# Patient Record
Sex: Female | Born: 1945 | Race: Black or African American | Hispanic: No | Smoking: Current some day smoker
Health system: Southern US, Community
[De-identification: ages and names within clinical notes are randomized; demographics above are authoritative.]

## PROBLEM LIST (undated history)

## (undated) DIAGNOSIS — M549 Dorsalgia, unspecified: Secondary | ICD-10-CM

## (undated) DIAGNOSIS — G459 Transient cerebral ischemic attack, unspecified: Secondary | ICD-10-CM

## (undated) DIAGNOSIS — T7840XA Allergy, unspecified, initial encounter: Secondary | ICD-10-CM

## (undated) DIAGNOSIS — I10 Essential (primary) hypertension: Secondary | ICD-10-CM

## (undated) DIAGNOSIS — I2510 Atherosclerotic heart disease of native coronary artery without angina pectoris: Secondary | ICD-10-CM

## (undated) DIAGNOSIS — M1990 Unspecified osteoarthritis, unspecified site: Secondary | ICD-10-CM

## (undated) DIAGNOSIS — E785 Hyperlipidemia, unspecified: Secondary | ICD-10-CM

## (undated) DIAGNOSIS — I639 Cerebral infarction, unspecified: Secondary | ICD-10-CM

## (undated) DIAGNOSIS — K219 Gastro-esophageal reflux disease without esophagitis: Secondary | ICD-10-CM

## (undated) DIAGNOSIS — F419 Anxiety disorder, unspecified: Secondary | ICD-10-CM

## (undated) DIAGNOSIS — R55 Syncope and collapse: Secondary | ICD-10-CM

## (undated) DIAGNOSIS — J449 Chronic obstructive pulmonary disease, unspecified: Secondary | ICD-10-CM

## (undated) DIAGNOSIS — I219 Acute myocardial infarction, unspecified: Secondary | ICD-10-CM

## (undated) DIAGNOSIS — G5600 Carpal tunnel syndrome, unspecified upper limb: Secondary | ICD-10-CM

## (undated) HISTORY — DX: Atherosclerotic heart disease of native coronary artery without angina pectoris: I25.10

## (undated) HISTORY — PX: TUBAL LIGATION: SHX77

## (undated) HISTORY — DX: Acute myocardial infarction, unspecified: I21.9

## (undated) HISTORY — DX: Dorsalgia, unspecified: M54.9

## (undated) HISTORY — DX: Transient cerebral ischemic attack, unspecified: G45.9

## (undated) HISTORY — DX: Gastro-esophageal reflux disease without esophagitis: K21.9

## (undated) HISTORY — DX: Allergy, unspecified, initial encounter: T78.40XA

## (undated) HISTORY — DX: Anxiety disorder, unspecified: F41.9

## (undated) HISTORY — PX: PAROTIDECTOMY: SUR1003

## (undated) HISTORY — DX: Syncope and collapse: R55

## (undated) HISTORY — DX: Cerebral infarction, unspecified: I63.9

## (undated) HISTORY — DX: Chronic obstructive pulmonary disease, unspecified: J44.9

## (undated) HISTORY — DX: Hyperlipidemia, unspecified: E78.5

## (undated) HISTORY — PX: CARDIAC CATHETERIZATION: SHX172

## (undated) HISTORY — DX: Carpal tunnel syndrome, unspecified upper limb: G56.00

## (undated) HISTORY — DX: Unspecified osteoarthritis, unspecified site: M19.90

## (undated) HISTORY — DX: Essential (primary) hypertension: I10

---

## 2005-03-27 ENCOUNTER — Emergency Department (HOSPITAL_COMMUNITY): Admission: EM | Admit: 2005-03-27 | Discharge: 2005-03-27 | Payer: Self-pay | Admitting: Emergency Medicine

## 2007-10-20 ENCOUNTER — Ambulatory Visit: Payer: Self-pay | Admitting: Cardiology

## 2008-01-28 ENCOUNTER — Ambulatory Visit: Payer: Self-pay | Admitting: Cardiology

## 2008-01-29 ENCOUNTER — Encounter: Payer: Self-pay | Admitting: Cardiology

## 2008-02-18 ENCOUNTER — Encounter: Payer: Self-pay | Admitting: Cardiology

## 2008-04-09 ENCOUNTER — Encounter: Payer: Self-pay | Admitting: Cardiology

## 2008-08-08 ENCOUNTER — Ambulatory Visit: Payer: Self-pay | Admitting: Cardiology

## 2008-08-29 ENCOUNTER — Ambulatory Visit: Payer: Self-pay | Admitting: Cardiology

## 2008-10-06 ENCOUNTER — Ambulatory Visit: Payer: Self-pay | Admitting: Cardiology

## 2009-04-13 ENCOUNTER — Ambulatory Visit: Payer: Self-pay | Admitting: Cardiology

## 2009-04-13 DIAGNOSIS — E785 Hyperlipidemia, unspecified: Secondary | ICD-10-CM | POA: Insufficient documentation

## 2009-04-13 DIAGNOSIS — R040 Epistaxis: Secondary | ICD-10-CM | POA: Insufficient documentation

## 2009-04-13 DIAGNOSIS — D696 Thrombocytopenia, unspecified: Secondary | ICD-10-CM | POA: Insufficient documentation

## 2009-04-13 DIAGNOSIS — G459 Transient cerebral ischemic attack, unspecified: Secondary | ICD-10-CM | POA: Insufficient documentation

## 2011-02-15 NOTE — Assessment & Plan Note (Signed)
[facility]                          [facility]   Sophia Briggs, Sophia Briggs              MRN:          [id number]  DATE:04/13/2009                            DOB:          [birth year]    REFERRING PHYSICIAN:  Dr. Ernestine Conrad   HISTORY OF PRESENT ILLNESS:  The patient is a very pleasant 65 year old  female with a history of coronary artery disease.  The patient has no  recurrent substernal chest pain.  She complains of significant weight  gain of almost 20 pounds.  She did receive steroid injections up until a  few months ago for back problems.  She was wondering also whether she  had increased fluid gain and she was given a diuretic by Dr. Loney Hering.  However, on clinical exam, the patient has no peripheral edema.  She  also states that she is not particularly increased her caloric intake on  her diet, although she does like mac and cheese.   MEDICATIONS:  1. Imdur 30 mg p.o. daily.  2. Vitamin D.  3. Amlodipine 5 mg p.o. daily.  4. Protonix 40 mg p.o. daily.  5. Coreg 25 mg p.o. q.12 h.  6. Crestor 10 mg one-half tablet p.o. daily.  7. Aspirin 81 mg a day.  8. Lisinopril hydrochlorothiazide 10/12.5 mg p.o. daily.  9. Nitroglycerine p.r.n.  10.Vicodin p.r.n.   PHYSICAL EXAMINATION:  VITAL SIGNS:  Blood pressure is 138/87, heart  rate is 65, and weight is 201.  HEENT:  Pupils, eyes are equal.  Conjunctivae clear.  NECK:  Supple.  Normal carotid upstroke.  No carotid bruits.  LUNGS:  Clear breath sounds bilaterally.  HEART:  Regular rate and rhythm.  Normal S1 and S2.  No murmur, rubs, or  gallops.  ABDOMEN:  Soft and nontender.  No rebound or guarding.  Good bowel  sounds.  EXTREMITIES:  No cyanosis, clubbing, or edema.  NEUROLOGIC:  The patient is alert and oriented.  Grossly nonfocal.   PROBLEM LIST:  1. Coronary artery disease.      a.     Status post myocardial infarction, treated medically with       patent stent  site.      b.     Status post myocardial infarction/Taxus stenting over the       left anterior descending artery in 2006 at [facility].      c.     Normal adenosine Cardiolite stress study with ejection       fraction of 68% in January 2009.  2. Lower back pain followed at [facility].  3. Hypertension.  4. Weight gain.  5. Dyslipidemia.  6. History of thrombocytopenic.  7. History of epistaxis.  8. History of transient ischemic attack.   PLAN:  1. The patient is stable.  I made no change in her medical regimen.  2. I educated the patient about exercise and weight control.  3. No evidence of definite volume overload and I did not change her      diuretic therapy.     Learta Codding, MD,FACC  Electronically Signed    GED/MedQ  DD: 04/13/2009  DT: 04/14/2009  Job #: [id number]   cc:   Dr. Ernestine Conrad

## 2011-02-15 NOTE — Assessment & Plan Note (Signed)
[facility]                          [facility]   Sophia Briggs, Sophia Briggs              MRN:          [id number]  DATE:08/08/2008                            DOB:          01-Jul-1946    CARDIOLOGIST:  Learta Codding, MD, [facility].   PRIMARY CARE PHYSICIAN:  Dr. Ernestine Briggs.   REASON FOR VISIT:  A 24-month followup.   HISTORY OF PRESENT ILLNESS:  Sophia Briggs is a 65 year old female patient  with a history of coronary artery disease status post prior myocardial  infarction in 2006 treated with a Taxus drug-eluting stent to the LAD at  [facility].  She had subsequent followup cardiac  catheterization in May 2007 that revealed nonobstructive coronary artery  disease.  She was seen by our service at [facility] in June 2009  and underwent a Cardiolite study that demonstrated normal LV function  with an EF of 68% and no ischemia.  She was last seen in the office in  April 2009.  She returns for followup today.  We referred her to  Neurology and she subsequently was referred to orthopedics secondary to  significant lower back problems as well as lower extremity weakness.  She is due to follow up with orthopedics in the next week.  She has been  having a significant amount of leg pain.  She also notes chest  discomfort.  She had an episode of chest discomfort this past weekend  that was somewhat severe and lasted almost all day.  It seems to be  related to meals.  She had a burning-type sensation.  She had no  radiation to her arm or jaw.  She did note some slight shortness of  breath.  She took Alka-Seltzer with minimal relief.  She denies any  syncope.  She has been working out at [employer] with [employer] 3  times a week.  She notes occasional exertional chest discomfort.  She  states that this is fairly new over the last 2-3 months.  She also notes  episodes of exertion without resultant chest discomfort.  She  does note  some symptoms of acid reflux in the past.  She was on Protonix  previously, but was subsequently taken off of this.   Of note, at her last visit, we arranged arterial Dopplers and ABIs.  Her  ABIs were normal bilaterally.   CURRENT MEDICATIONS:  1. Lisinopril 10 mg daily.  2. Crestor 5 mg daily.  3. Hydroxyzine 25 mg daily.  4. Toprol-XL 25 mg daily.  5. Imdur 30 mg daily.  6. Aspirin 325 mg daily.  7. Vitamin D 1 g 2 tablets b.i.d.   ALLERGIES:  No known drug allergies.   SOCIAL HISTORY:  The patient quit smoking about 3 months ago.   REVIEW OF SYSTEMS:  Please see HPI.  She denies any fever, chills,  cough, melena, hematochezia, hematuria, or dysuria.  The rest of the  review of systems are negative.   PHYSICAL EXAMINATION:  GENERAL:  She is a well-nourished, well-developed  female in acute distress.  VITAL SIGNS:  Blood pressure is 156/99, pulse 55,  and weight 191.8  pounds.  HEENT:  Normal.  NECK:  Without JVD.  LYMPH:  Without lymphadenopathy.  CARDIAC:  Normal S1 and S2.  Regular rate and rhythm.  LUNGS:  Clear to auscultation bilaterally.  ABDOMEN:  Soft and nontender.  EXTREMITIES:  Without edema.  NEUROLOGIC:  She is alert and oriented x3.  Cranial nerves II-XII are  grossly intact.   Electrocardiogram reveals sinus bradycardia with a heart rate of 56,  normal axis, and no acute changes.   ASSESSMENT AND PLAN:  1. Chest pain.  She has a history of coronary artery disease status      post Taxus drug-eluting placement to the left anterior descending      in 2006 at [facility] and nonobstructive disease      by cardiac catheterization in May 2007.  Specifically at that time,      she had a 50% ostial diagonal lesion and 30% proximal left anterior      descending lesion.  Followup Cardiolite testing in January 2009      demonstrated no ischemia.  Her ejection fraction is well preserved.      Her symptoms are atypical and sound more  consistent with acid      reflux disease.  However, she has noted a change in the pattern of      her chest symptoms.  According to the prior note, she has had a      history of chronic stable chest pain since her intervention in      2006.  She does note some symptoms with exertion.  She also notes      an absence of symptoms with exertion at times as well.  I have      recommended that we proceed with stress Cardiolite testing to rule      out the possibility of ischemia.  I have also recommended that we      will place her on proton pump inhibitor therapy to cover for      gastrointestinal etiology.  We will see her back in several weeks      in followup.  2. Hypertension.  This is uncontrolled.  I have elected to place her      on amlodipine 5 mg daily.  This would serve as an antianginal as      well.  3. Dyslipidemia.  She is having some leg pain.  She questions whether      or not this is related to her Crestor.  I have explained to her      that she can come off her Crestor for couple of weeks to see if      this alleviates any of her symptoms.  We will follow up with a CMET      and lipid panel to reassess her lipid management.  4. History of thrombocytopenia.  At the last check in July 2009, her      platelets were stable at 135.  We will check a repeat CBC to follow      up on her thrombocytopenia.  5. Chronic low back pain and leg pain.  She will continue to follow up      with the neurologist and orthopedist as directed.  6. Gastroesophageal reflux disease.  As outlined above, we will      reinitiate Protonix at 40 mg daily.   DISPOSITION:  The patient will be brought back in followup with Dr.  Andee Lineman in  the next 6 weeks or sooner p.r.n.       Tereso Newcomer, PA-C  Electronically Signed      Learta Codding, MD,FACC  Electronically Signed   SW/MedQ  DD: 08/08/2008  DT: 08/09/2008  Job #: [id number]   cc:   Sophia Briggs

## 2011-02-15 NOTE — Assessment & Plan Note (Signed)
[facility]                          [facility]   JISELL, MAJER              MRN:          [id number]  DATE:08/29/2008                            DOB:          Aug 26, 1946    PRIMARY CARDIOLOGIST:  Learta Codding, MD,FACC   REASON FOR VISIT:  Post-hospital followup.   Ms. Henneke reportedly was diagnosed with a heart attack earlier this  month while she was visiting in the [place] area recently.  She was  reportedly briefly hospitalized at [facility] (11/8 - 11, 2009), following a  complaint of chest pain.  She was reportedly hypertensive and underwent  subsequent cardiac catheterization, suggesting continued patency of her  previously placed stents.  There are currently no hospital records for  review.   Ms. Phoenix was placed on amlodipine, Coreg, and had uptitration of both  aspirin and Imdur.   The patient denies any recurrent angina pectoris.  She also reports much  better control of her blood pressure.   Ms. Weyandt is also currently undergoing evaluation for lower back pain  radiating down her left leg.  She apparently is being treated for a  probable herniated disk.  She does have scheduled followup at [facility]  regarding this.   EKG today indicates sinus bradycardia at 52 bpm with normal axis and  nonspecific ST abnormalities.   CURRENT MEDICATIONS:  1. Lisinopril 10 daily.  2. Imdur 30 daily.  3. Aspirin 325 daily.  4. Amlodipine 5 daily.  5. Protonix.  6. Coreg to 25 q. 12.   PHYSICAL EXAMINATION:  GENERAL:  Blood pressure 136/72, pulse 64,  regular, and respiration 16.  GENERAL:  A 65 year old female sitting upright in no distress.  HEENT:  Normocephalic, atraumatic.  NECK:  Palpable bilateral carotid pulses without bruits; no JVD.  LUNGS:  Clear to auscultation in all fields.  HEART:  Regular rate and rhythm.  No significant murmurs.  No rubs.  ABDOMEN:  Soft, nontender.  EXTREMITIES:  Stable right  groin with no hematoma, ecchymosis, bruit, or  auscultation.  Intact femoral and distal pulses.  No focal deficit.   IMPRESSION:  1. Coronary artery disease.      a.     Status post reported, recent myocardial infarction, treated       medically, with reportedly patent stent sites.      b.     Status post myocardial infarction/ Taxus stenting of LAD in       2006 [facility]).      c.     Normal adenosine stress Cardiolite; EF 68%, January 2009.  2. Lower back pain.      a.     Presumably secondary to herniated disks.      b.     Nonobstructive CAD by catheterization, May 2007.  3. Hypertension, much improved.  4. Dyslipidemia.  5. History of thrombocytopenia.  6. History of epistaxis, Plavix discontinued.  7. History of transient ischemic attacks.   PLAN:  1. Continue current medication regimen.  2. Request hospital records from [facility], with respect to her recent      hospitalization.  3. Resume Crestor  at previous dose.  This was recently stopped,      following complaint of persistent leg pain.  She indicates that      this did not resolve her symptoms and it is, therefore, unlikely      that these pains were related to Crestor.  We will resume the      Crestor and then reassess her lipid status in 3 months, following      her next office visit.      Gene Serpe, PA-C  Electronically Signed      Learta Codding, MD,FACC  Electronically Signed   GS/MedQ  DD: 08/29/2008  DT: 08/29/2008  Job #: [id number]   cc:   Darcus Pester, MD

## 2011-02-15 NOTE — Assessment & Plan Note (Signed)
[facility]                          [facility]   IEESHA, ABBASI              MRN:          [id number]  DATE:01/28/2008                            DOB:          Jun 10, 1946    CARDIOLOGIST:  She is new to Dr. Andee Lineman.   PRIMARY CARE PHYSICIAN:  She is not yet established, but is going to try  to establish with [facility].   REASON FOR VISIT:  Post hospitalization follow up.   HISTORY OF PRESENT ILLNESS:  Ms. Morning is a very pleasant 65 year old  female patient with a history of coronary artery disease previously  followed by Dr. Dortha Kern of [facility] in [place].  Her  previous cardiac history includes what sounds like non-ST-elevation  myocardial infarction in 2006.  She was initially seen at [facility] and discharged.  She presented back with chest pain and was  transferred to [facility] where she underwent a cardiac  catheterization that revealed a 90% proximal LAD lesion and a 90% ostial  diagonal lesion.  Her EF was 60-65%.  At that time, she was treated with  a Taxus drug-eluting stent, 3 mm x 8 mm.  She had a follow-up cardiac  catheterization in 2007 that revealed a 50% ostial diagonal lesion, 30%  proximal LAD lesion.  She had normal LV systolic function, and her  aortogram distal aortogram revealed mild, less than 30% right renal  artery stenosis.   She was seen by one of our cardiology fellows at [facility] on  October 20, 2007 secondary to elevated cardiac troponins.  The patient  had presented with symptoms of epistaxis in the setting of accelerated  hypertension with a blood pressure of 240/120.  She also exhibited some  left-sided paresthesias concerning for TIA symptoms in the setting of  accelerated hypertension.  Of note, her cardiac enzymes were abnormal.  Her troponins went from 0.02 to 0.01 to 0.18 to 0.18 to 0.16.  However,  her CK-MBs remained  completely negative throughout.   While hospitalized, the patient underwent an echocardiogram and  Cardiolite stress study.  Her adenosine Cardiolite revealed an EF of 68%  and normal LV perfusion.  Her echocardiogram revealed an EF of 55-60%,  mild LVH.   Given the patient's accelerated hypertension, she also underwent CT  angiogram to rule out renal artery stenosis.  She had a normal aorta and  mesenteric arteries.  She had mild calcified plaque in the bilateral  proximal renal arteries.  There was no significant renal artery stenosis  noted.  Also of note, she had an MRI of the brain that showed no acute  intracranial abnormality.  She did have mild chronic demyelination  changes in the right and left frontal lobe white matter.   The patient was initially supposed to be seen by [place] in follow-up in  March, but her appointment was changed due to an emergency in the  office.  She returns today for follow up.  She notes overall she is  doing well.  She does have some symptoms of leg pain.  She actually has  several symptoms.  One  includes discomfort in bilateral lower  extremities at night.  This is relieved with moving her extremities.  She also has pain from time to time in her legs with walking.  It that  seems to resolve with rest.  She also has weakness in her lower  extremities.  This had been ongoing for several months now.  She denies  any syncope, near syncope or dizziness or lightheadedness.  As she gets  up from a lying or sitting position, her legs become extremely weak and  she falls.  She has fallen several times and actually has to walk with a  cane now.   The patient notes chronic exertional chest pain since her cardiac  catheterization and stenting in 2006.  This is fairly stable without  change.  She denies any significant dyspnea on exertion.  She denies any  syncope or palpitations.   The patient does note to me today a symptom of chest discomfort with  eating.   She denies any true dysphagia or odynophagia.  She has been  diagnosed with acid reflux disease in the past.   CURRENT MEDICATIONS:  1. Lisinopril 10 mg daily.  2. Imdur 15 mg daily.  3. Crestor 5 mg daily.  4. Hydroxyzine 25 mg daily.  5. Aspirin 81 mg daily.  6. Toprol XL 25 mg daily.  7. Vistaril p.r.n.  8. Hydroxyzine p.r.n.  9. Advair p.r.n.   ALLERGIES:  NO KNOWN DRUG ALLERGIES.   PHYSICAL EXAMINATION:  GENERAL:  She is a well-nourished, well-developed  female in no distress.  VITAL SIGNS:  Blood pressure is 128/80, pulse 66, weight 192.6 pounds,  oxygen saturation 97% on room air.  HEENT:  Normal.  NECK:  Without JVD, without lymphadenopathy.  CARDIAC:  Normal S1 and S2.  Regular rate and rhythm.  LUNGS:  Clear to auscultation bilaterally.  ABDOMEN:  Soft, nontender.  EXTREMITIES:  Without edema.  Calves soft, nontender.  SKIN:  Warm and dry.  NEUROLOGIC:  She is alert and oriented x3.  Cranial nerves II-XII  grossly intact.  VASCULAR:  Dorsalis pedis pulse and posterior tibialis pulses 2+  bilaterally.  A cannot appreciate any femoral artery bruits bilaterally.  NEUROLOGIC:  Distal sensation is intact.  Patellar and Achilles deep  tendon reflexes are 2+ bilaterally.  MUSCULOSKELETAL:  She has 5/5 strength in bilateral lower extremities.   IMPRESSION:  1. Coronary artery disease.      a.     Status post Taxus drug-eluting stent placement to the LAD in       2006 at [facility].      b.     Nonobstructive coronary artery disease by catheterization in       May of 2007.      c.     Recent nonischemic adenosine Cardiolite in January of 2009.      d.     Chronic stable angina pectoris.  2. Good left ventricular function.  3. Hypertension.      a.     Recent admission with accelerated hypertension and transient       ischemic attack symptoms.  4. Treated dyslipidemia.  Lipid panel in January of 2009 with a total      cholesterol of 103, HDL 34, LDL  58 and triglycerides of 55.  5. Recent history of epistaxis and thrombocytopenia.      a.     Platelet count as low as 119,000 while hospitalized.  6. Leg pain.  a.     Question intermittent claudication.      b.     Question restless leg syndrome.  7. History of lower extremity weakness - rule out neurologic process.  8. Gastroesophageal reflux disease.   DISCUSSION:  Ms. Oshea returns to the office today for post  hospitalization follow up.  Overall, from a cardiac standpoint she seems  to be fairly stable.  She describes chest symptoms that seem to be  consistent with stable exertional angina pectoris.  There has been no  significant change since her stenting in 2006.  She recently was  admitted with epistaxis and thrombocytopenia.  Her thrombocytopenia was  fairly mild.  However, her Plavix was discontinued.  It had been 3 years  since her stenting.  She is continued on aspirin, albeit at 81 mg a day.  She did have a drug-eluting stent placed in 2006.  She also has other  symptoms.  She does describe some chest symptoms with eating that seem  to be consistent with gastroesophageal reflux disease.  She also  describes some lower extremity weakness from time to time.  The etiology  of this is unclear, and I think she needs further evaluation.  She also  describes some leg pain.  She is due to see [facility] soon.  I think further workup for restless leg syndrome should be pursued.  In  the interim, we will work her up for vascular disease in her lower  extremities.   PLAN:  1. Increase Imdur to 30 mg daily for anti-anginal effect.  2. Prescribe ranitidine 150 mg b.i.d. for treatment of acid reflux      disease.  3. I have asked her to increase her aspirin to 325 mg daily, as she      does have a history of Taxus drug-eluting stent in her LAD.  She      will continue off of Plavix.  We will check a CBC today and follow      up on a CBC in 2 weeks to ensure that  her platelet counts are      stable.  4. She will be set up for ABIs and arterial Dopplers to rule out      significant vascular disease contributing to some of her leg      symptoms.  I have asked her to follow up with her new primary care      physician for further evaluation of restless leg syndrome.  5. We will also refer her to neurology for her leg weakness.  Her      neurologic exam seems to be stable today without deficit.  Further      recommendations will be per neurology.  6. The patient will be brought back in follow up in the next 3 months.      In the interim, we will follow up on LFTs as she does continue on      Crestor therapy.      Tereso Newcomer, PA-C  Electronically Signed      Learta Codding, MD,FACC  Electronically Signed   SW/MedQ  DD: 01/28/2008  DT: 01/28/2008  Job #: [id number]

## 2011-02-15 NOTE — Assessment & Plan Note (Signed)
[facility]                          [facility]   Sophia Briggs, SCHNEEBERGER              MRN:          [id number]  DATE:10/06/2008                            DOB:          03-03-1946    REFERRING PHYSICIAN:  Ernestine Conrad, MD   HISTORY OF PRESENT ILLNESS:  The patient is a 65 year old female who was  recently hospitalized at [facility] with presumed  heart attack.  We still have not received the results, but report  related the patient with nonobstructive coronary artery disease and no  stent was placed.  She was already seen by PA in the meanwhile on  August 29, 2008.  From cardiac standpoint, she is doing well.  She has  no chest pain, shortness of breath, orthopnea, or PND.  She does have  left leg pain and has undergone steroid injection, and still scheduled  go back for TENS unit.   MEDICATIONS:  1. Imdur 30 mg p.o. daily.  2. Vitamin D.  3. Amlodipine 5 mg as directed.  4. Protonix 40 mg 1 tablet p.o. daily.  5. Coreg 25 mg p.o. q.12 h.  6. Crestor 10 mg p.o. daily.  7. Aspirin 81 mg p.o. daily.   PHYSICAL EXAMINATION:  VITAL SIGNS:  Blood pressure 133/82, heart rate  62, and weight 187 pounds.  NECK:  Normal carotid upstroke.  No carotid bruits.  LUNGS:  Clear breath sounds bilaterally.  HEART:  Regular rate and rhythm.  Normal S1 and S2.  No murmurs, rubs,  or gallops.  ABDOMEN:  Soft and nontender.  No rebound or guarding.  Good bowel  sounds.  EXTREMITIES:  No cyanosis, clubbing, or edema.  NEUROLOGIC:  The patient is alert, oriented, and grossly nonfocal.   PROBLEMS:  1. Coronary artery disease.      a.     Status post recent myocardial infarction, treated medically       with patent stent sites.      b.     Status post myocardial infarction/Taxus stenting of left       anterior descending in 2006 [facility]).      c.     Normal adenosine Cardiolite stress; ejection fraction  68%,       January 2009.  2. Lower back pain, followed at [facility].  3. Hypertension, controlled.  4. Dyslipidemia.  5. History of thrombocytopenia.  6. History of epistaxis.  7. History of transient ischemic attack.   PLAN:  1. The patient from cardiac standpoint is doing well.  I made no      change in medical regimen.  Her blood pressure is well controlled.  2. The patient needs to continue on risk factor modification.  3. The patient can follow up at [facility] for her leg pain.     Learta Codding, MD,FACC  Electronically Signed    GED/MedQ  DD: 10/06/2008  DT: 10/07/2008  Job #: [id number]   cc:   Ernestine Conrad, MD

## 2011-02-18 NOTE — Letter (Signed)
Feb 18, 2008    [employer]  RE: [name]  [address]  [place], [place] [address]   RE:  [name], [name]  MRN:  [id number]  /  DOB:  02/02/1946   To Whom It May Concern:   Sophia Briggs is a 65 year old female patient followed in our practice  with a history of coronary artery disease.  She has a prior history of  myocardial infarction treated with stenting to her coronary artery.  She  had a recent admission to [facility] in January of 2009 with  accelerated hypertension and transient ischemic attack symptoms.  She  was last seen in follow-up in our office in April of 2009.  Unfortunately, she continues to experience chest discomfort.  The  etiology of this is not entirely clear.  She also had a recent complaint  of bilateral lower extremity pain and weakness.  The workup of this is  ongoing.  Given Sophia Briggs's multiple medical problems and symptoms,  she does not feel she can participate in jury duty.  In light of the  aforementioned problems, I would agree with that.  If at all possible,  please excuse her from her upcoming jury duty.  If you have any  questions, please feel free to contact our office in [place] at [phone].    Sincerely,       Tereso Newcomer, PA-C  Electronically Signed      Learta Codding, MD,FACC  Electronically Signed   SW/MedQ  DD: 02/18/2008  DT: 02/18/2008  Job #: [id number]

## 2012-01-06 DIAGNOSIS — G5600 Carpal tunnel syndrome, unspecified upper limb: Secondary | ICD-10-CM

## 2012-01-06 HISTORY — DX: Carpal tunnel syndrome, unspecified upper limb: G56.00

## 2012-04-10 ENCOUNTER — Encounter: Payer: Self-pay | Admitting: Cardiology

## 2013-04-16 ENCOUNTER — Inpatient Hospital Stay: Payer: Self-pay | Admitting: Internal Medicine

## 2013-04-16 DIAGNOSIS — I214 Non-ST elevation (NSTEMI) myocardial infarction: Secondary | ICD-10-CM

## 2013-04-16 LAB — CBC
HGB: 13.5 g/dL (ref 12.0–16.0)
MCHC: 32.9 g/dL (ref 32.0–36.0)
MCV: 84 fL (ref 80–100)
Platelet: 112 10*3/uL — ABNORMAL LOW (ref 150–440)

## 2013-04-16 LAB — COMPREHENSIVE METABOLIC PANEL
Alkaline Phosphatase: 91 U/L (ref 50–136)
Anion Gap: 6 — ABNORMAL LOW (ref 7–16)
Calcium, Total: 8.7 mg/dL (ref 8.5–10.1)
Chloride: 106 mmol/L (ref 98–107)
EGFR (African American): >60
Glucose: 117 mg/dL — ABNORMAL HIGH (ref 65–99)
SGOT(AST): 29 U/L (ref 15–37)
Sodium: 138 mmol/L (ref 136–145)
Total Protein: 7.2 g/dL (ref 6.4–8.2)

## 2013-04-16 LAB — URINALYSIS, COMPLETE
Bacteria: NONE SEEN
Nitrite: NEGATIVE
Ph: 8 (ref 4.5–8.0)
Protein: NEGATIVE
RBC,UR: <1 /HPF (ref 0–5)
WBC UR: <1 /HPF (ref 0–5)

## 2013-04-16 LAB — PROTIME-INR
INR: 1
Prothrombin Time: 13.3 secs (ref 11.5–14.7)

## 2013-04-16 LAB — LIPASE, BLOOD: Lipase: 131 U/L (ref 73–393)

## 2013-04-16 LAB — TROPONIN I: Troponin-I: 0.67 ng/mL — ABNORMAL HIGH

## 2013-04-16 LAB — CK TOTAL AND CKMB (NOT AT ARMC): CK, Total: 154 U/L (ref 21–215)

## 2013-04-16 LAB — MAGNESIUM: Magnesium: 1.8 mg/dL

## 2013-04-17 ENCOUNTER — Encounter: Payer: Self-pay | Admitting: Cardiovascular Disease

## 2013-04-17 ENCOUNTER — Telehealth: Payer: Self-pay | Admitting: *Deleted

## 2013-04-17 DIAGNOSIS — I2510 Atherosclerotic heart disease of native coronary artery without angina pectoris: Secondary | ICD-10-CM

## 2013-04-17 LAB — BASIC METABOLIC PANEL
BUN: 14 mg/dL (ref 7–18)
Calcium, Total: 8.6 mg/dL (ref 8.5–10.1)
Chloride: 109 mmol/L — ABNORMAL HIGH (ref 98–107)
Co2: 27 mmol/L (ref 21–32)
Creatinine: 0.8 mg/dL (ref 0.60–1.30)
EGFR (African American): >60
EGFR (Non-African Amer.): >60
Glucose: 94 mg/dL (ref 65–99)
Potassium: 3.8 mmol/L (ref 3.5–5.1)
Sodium: 141 mmol/L (ref 136–145)

## 2013-04-17 LAB — CBC WITH DIFFERENTIAL/PLATELET
Basophil #: 0 10*3/uL (ref 0.0–0.1)
Basophil %: 0.7 %
Eosinophil #: 0.1 10*3/uL (ref 0.0–0.7)
HCT: 42.2 % (ref 35.0–47.0)
HGB: 13.7 g/dL (ref 12.0–16.0)
Lymphocyte #: 1.7 10*3/uL (ref 1.0–3.6)
Lymphocyte %: 27.6 %
MCH: 27.5 pg (ref 26.0–34.0)
MCV: 85 fL (ref 80–100)
Monocyte %: 11.7 %
Neutrophil #: 3.5 10*3/uL (ref 1.4–6.5)
WBC: 6 10*3/uL (ref 3.6–11.0)

## 2013-04-17 NOTE — Telephone Encounter (Signed)
See below

## 2013-04-17 NOTE — Telephone Encounter (Signed)
Tcm pt see below.

## 2013-04-17 NOTE — Telephone Encounter (Signed)
Message copied by Kendrick Fries on Wed Apr 17, 2013  2:55 PM ------      Message from: Christell Faith      Created: Wed Apr 17, 2013  2:18 PM      Regarding: tcm/ph       TCM/ph - Dr. Mariah Milling 05/06/13 @ 10:45 ------

## 2013-04-17 NOTE — Telephone Encounter (Signed)
Daughter has questions re: her mothers care....please advise. Sophia Briggs is having a procdure. She would like to have Dr. Mariah Milling to call her

## 2013-04-18 NOTE — Telephone Encounter (Signed)
Patient contacted regarding discharge from [facility] on 04/17/13.  Patient understands to follow up with provider Dr Mariah Milling on 05/06/13 at 10:45a in [place]. Patient understands discharge instructions? yes Patient understands medications and regiment? yes Patient understands to bring all medications to this visit? Yes

## 2013-05-06 ENCOUNTER — Encounter: Payer: Self-pay | Admitting: Cardiovascular Disease

## 2013-05-06 ENCOUNTER — Ambulatory Visit (INDEPENDENT_AMBULATORY_CARE_PROVIDER_SITE_OTHER): Payer: Medicare Other | Admitting: Cardiovascular Disease

## 2013-05-06 VITALS — BP 106/82 | HR 62 | Ht 67.0 in | Wt 192.8 lb

## 2013-05-06 DIAGNOSIS — Z87891 Personal history of nicotine dependence: Secondary | ICD-10-CM

## 2013-05-06 DIAGNOSIS — R079 Chest pain, unspecified: Secondary | ICD-10-CM

## 2013-05-06 DIAGNOSIS — I2510 Atherosclerotic heart disease of native coronary artery without angina pectoris: Secondary | ICD-10-CM

## 2013-05-06 DIAGNOSIS — E785 Hyperlipidemia, unspecified: Secondary | ICD-10-CM | POA: Insufficient documentation

## 2013-05-06 NOTE — Assessment & Plan Note (Signed)
Continue Crestor. Goal LDL less than 70 

## 2013-05-06 NOTE — Progress Notes (Signed)
Patient ID: Sophia Briggs, female    DOB: 10/11/1945, 67 y.o.   MRN: [id number]  HPI Comments: Sophia Briggs is a pleasant 67 year old woman with a history of coronary artery disease, previous stent x2 in 2009 to her LAD, hypertension, hyperlipidemia, tobacco abuse, history of TIA who presented to the hospital 04/16/2013 with chest pain. Initial troponin 0.6 with MB greater than 5.  She had stuttering chest pain before coming into the hospital. Cardiac catheterization showed severe ostial D2 and D3 disease. The D2 ostium was at the distal end of the LAD stent. LAD stent was patent. D3 was a small vessel. Ejection fraction 55%. No intervention was performed. Recommendations made after discussion with interventional cardiology was to manage medically.  Since her discharge, she has felt well. No further episodes of chest pain. She is tolerating her medications well EKG from July 15 shows normal sinus rhythm with rate 62 beats per minute, left bundle branch block   Outpatient Encounter Prescriptions as of 05/06/2013  Medication Sig Dispense Refill  . aspirin 81 MG tablet Take 162 mg by mouth daily.       . carvedilol (COREG) 25 MG tablet Take 25 mg by mouth 2 (two) times daily with a meal.      . cholecalciferol (VITAMIN D) 1000 UNITS tablet Take 1,000 Units by mouth 2 (two) times daily.      . Fluticasone-Salmeterol (ADVAIR) 250-50 MCG/DOSE AEPB Inhale 1 puff into the lungs every 12 (twelve) hours.      . isosorbide mononitrate (IMDUR) 30 MG 24 hr tablet Take 30 mg by mouth daily.      Marland Kitchen lisinopril (PRINIVIL,ZESTRIL) 10 MG tablet Take 10 mg by mouth daily.      . nitroGLYCERIN (NITROSTAT) 0.4 MG SL tablet Place 0.4 mg under the tongue every 5 (five) minutes as needed.      Marland Kitchen oxyCODONE-acetaminophen (PERCOCET/ROXICET) 5-325 MG per tablet Take 1 tablet by mouth every 4 (four) hours as needed.       . pantoprazole (PROTONIX) 40 MG tablet Take 40 mg by mouth daily.      . rosuvastatin (CRESTOR)  5 MG tablet Take 5 mg by mouth daily.      . sertraline (ZOLOFT) 50 MG tablet Take 50 mg by mouth as needed.         Review of Systems  Constitutional: Negative.   HENT: Negative.   Eyes: Negative.   Respiratory: Negative.   Cardiovascular: Negative.   Gastrointestinal: Negative.   Musculoskeletal: Negative.   Skin: Negative.   Neurological: Negative.   Psychiatric/Behavioral: Negative.   All other systems reviewed and are negative.    BP 106/82  Pulse 62  Ht 5\' 7"  (1.702 m)  Wt 192 lb 12 oz (87.431 kg)  BMI 30.18 kg/m2  Physical Exam  Nursing note and vitals reviewed. Constitutional: She is oriented to person, place, and time. She appears well-developed and well-nourished.  HENT:  Head: Normocephalic.  Nose: Nose normal.  Mouth/Throat: Oropharynx is clear and moist.  Eyes: Conjunctivae are normal. Pupils are equal, round, and reactive to light.  Neck: Normal range of motion. Neck supple. No JVD present.  Cardiovascular: Normal rate, regular rhythm, S1 normal, S2 normal, normal heart sounds and intact distal pulses.  Exam reveals no gallop and no friction rub.   No murmur heard. Pulmonary/Chest: Effort normal and breath sounds normal. No respiratory distress. She has no wheezes. She has no rales. She exhibits no tenderness.  Abdominal: Soft. Bowel sounds are  normal. She exhibits no distension. There is no tenderness.  Musculoskeletal: Normal range of motion. She exhibits no edema and no tenderness.  Lymphadenopathy:    She has no cervical adenopathy.  Neurological: She is alert and oriented to person, place, and time. Coordination normal.  Skin: Skin is warm and dry. No rash noted. No erythema.  Psychiatric: She has a normal mood and affect. Her behavior is normal. Judgment and thought content normal.    Assessment and Plan

## 2013-05-06 NOTE — Assessment & Plan Note (Signed)
Recent cardiac catheterization showing severe ostial D2 and D3 disease. Not easily amenable to intervention. D3 is a small vessel, D2 at the distal site of a stent. We'll continue nitroglycerin for chest pain.

## 2013-05-06 NOTE — Assessment & Plan Note (Signed)
She continues to smoke one or 2 cigarettes per day. We have encouraged her to continue to work on weaning her cigarettes and smoking cessation. She will continue to work on this and does not want any assistance with chantix.

## 2013-05-06 NOTE — Assessment & Plan Note (Signed)
Underlying CAD with severe ostial diagonal disease. Continue medical management. If she starts to have worsening chest pain episodes, may need to consider balloon angioplasty

## 2013-05-06 NOTE — Patient Instructions (Addendum)
You are doing well. No medication changes were made.  Goal total cholesterol less than 150 Goal LDL less than 70  Please call us if you have new issues that need to be addressed before your next appt.  Your physician wants you to follow-up in: 6 months.  You will receive a reminder letter in the mail two months in advance. If you don't receive a letter, please call our office to schedule the follow-up appointment.

## 2013-05-28 ENCOUNTER — Encounter: Payer: Self-pay | Admitting: Cardiovascular Disease

## 2013-07-26 DIAGNOSIS — M4807 Spinal stenosis, lumbosacral region: Secondary | ICD-10-CM | POA: Insufficient documentation

## 2013-10-11 DIAGNOSIS — M5416 Radiculopathy, lumbar region: Secondary | ICD-10-CM | POA: Insufficient documentation

## 2013-11-08 ENCOUNTER — Encounter: Payer: Self-pay | Admitting: Cardiovascular Disease

## 2013-11-08 ENCOUNTER — Ambulatory Visit (INDEPENDENT_AMBULATORY_CARE_PROVIDER_SITE_OTHER): Payer: Medicare HMO | Admitting: Cardiovascular Disease

## 2013-11-08 VITALS — BP 140/90 | HR 63 | Ht 67.0 in | Wt 189.2 lb

## 2013-11-08 DIAGNOSIS — R079 Chest pain, unspecified: Secondary | ICD-10-CM

## 2013-11-08 DIAGNOSIS — I2510 Atherosclerotic heart disease of native coronary artery without angina pectoris: Secondary | ICD-10-CM

## 2013-11-08 DIAGNOSIS — E785 Hyperlipidemia, unspecified: Secondary | ICD-10-CM

## 2013-11-08 NOTE — Progress Notes (Signed)
Patient ID: Sophia Briggs, female    DOB: 04-09-1946, 68 y.o.   MRN: [id number]  HPI Comments: Sophia Briggs is a pleasant 68 year old woman with a history of coronary artery disease, previous stent x2 in 2009 to her LAD, hypertension, hyperlipidemia, tobacco abuse, history of TIA who presented to the hospital 04/16/2013 with chest pain. Initial troponin 0.6 with MB greater than 5.  She had stuttering chest pain before coming into the hospital. Cardiac catheterization showed severe ostial D2 and D3 disease. The D2 ostium was at the distal end of the LAD stent. LAD stent was patent. D3 was a small vessel. Ejection fraction 55%. No intervention was performed. Recommendations made after discussion with interventional cardiology was to manage medically.  In followup today, she reports that she is doing well. She does have significant stress at home as her daughter is sick, has a tracheostomy tube, recent stroke. [facility] [place]. She denies any new symptoms of chest pain or shortness of breath. She is otherwise active with no complaints.  She is tolerating her medications well  EKG from shows normal sinus rhythm with rate 63 beats per minute, nonspecific ST abnormality, intraventricular conduction delay   Outpatient Encounter Prescriptions as of 11/08/2013  Medication Sig  . aspirin 81 MG tablet Take 162 mg by mouth daily.   . carvedilol (COREG) 25 MG tablet Take 25 mg by mouth 2 (two) times daily with a meal.  . cholecalciferol (VITAMIN D) 1000 UNITS tablet Take 1,000 Units by mouth 2 (two) times daily.  . Fluticasone-Salmeterol (ADVAIR) 250-50 MCG/DOSE AEPB Inhale 1 puff into the lungs every 12 (twelve) hours.  . isosorbide mononitrate (IMDUR) 30 MG 24 hr tablet Take 30 mg by mouth daily.  Marland Kitchen lisinopril (PRINIVIL,ZESTRIL) 10 MG tablet Take 10 mg by mouth daily.  . nitroGLYCERIN (NITROSTAT) 0.4 MG SL tablet Place 0.4 mg under the tongue every 5 (five) minutes as  needed.  Marland Kitchen oxyCODONE-acetaminophen (PERCOCET/ROXICET) 5-325 MG per tablet Take 1 tablet by mouth every 4 (four) hours as needed.   . pantoprazole (PROTONIX) 40 MG tablet Take 40 mg by mouth daily.  . sertraline (ZOLOFT) 50 MG tablet Take 50 mg by mouth as needed.   . simvastatin (ZOCOR) 5 MG tablet Take 2.5 mg by mouth at bedtime.     Review of Systems  Constitutional: Negative.   HENT: Negative.   Eyes: Negative.   Respiratory: Negative.   Cardiovascular: Negative.   Gastrointestinal: Negative.   Endocrine: Negative.   Musculoskeletal: Negative.   Skin: Negative.   Allergic/Immunologic: Negative.   Neurological: Negative.   Hematological: Negative.   Psychiatric/Behavioral: Negative.   All other systems reviewed and are negative.    BP 140/90  Pulse 63  Ht 5\' 7"  (1.702 m)  Wt 189 lb 4 oz (85.843 kg)  BMI 29.63 kg/m2  Physical Exam  Nursing note and vitals reviewed. Constitutional: She is oriented to person, place, and time. She appears well-developed and well-nourished.  HENT:  Head: Normocephalic.  Nose: Nose normal.  Mouth/Throat: Oropharynx is clear and moist.  Eyes: Conjunctivae are normal. Pupils are equal, round, and reactive to light.  Neck: Normal range of motion. Neck supple. No JVD present.  Cardiovascular: Normal rate, regular rhythm, S1 normal, S2 normal, normal heart sounds and intact distal pulses.  Exam reveals no gallop and no friction rub.   No murmur heard. Pulmonary/Chest: Effort normal and breath sounds normal. No respiratory distress. She has no wheezes. She has  no rales. She exhibits no tenderness.  Abdominal: Soft. Bowel sounds are normal. She exhibits no distension. There is no tenderness.  Musculoskeletal: Normal range of motion. She exhibits no edema and no tenderness.  Lymphadenopathy:    She has no cervical adenopathy.  Neurological: She is alert and oriented to person, place, and time. Coordination normal.  Skin: Skin is warm and dry. No  rash noted. No erythema.  Psychiatric: She has a normal mood and affect. Her behavior is normal. Judgment and thought content normal.    Assessment and Plan

## 2013-11-08 NOTE — Assessment & Plan Note (Signed)
Recommended that she try to go back on Crestor. She's not tolerating even low-dose simvastatin

## 2013-11-08 NOTE — Patient Instructions (Signed)
You are doing well. No medication changes were made.  Please call us if you have new issues that need to be addressed before your next appt.  Your physician wants you to follow-up in: 6 months.  You will receive a reminder letter in the mail two months in advance. If you don't receive a letter, please call our office to schedule the follow-up appointment.   

## 2013-11-08 NOTE — Assessment & Plan Note (Signed)
Currently with no symptoms of angina. No further workup at this time. Continue current medication regimen. 

## 2014-05-21 ENCOUNTER — Ambulatory Visit (INDEPENDENT_AMBULATORY_CARE_PROVIDER_SITE_OTHER): Payer: Medicare HMO | Admitting: Cardiovascular Disease

## 2014-05-21 ENCOUNTER — Encounter: Payer: Self-pay | Admitting: Cardiovascular Disease

## 2014-05-21 VITALS — BP 120/90 | HR 63 | Ht 67.0 in | Wt 193.5 lb

## 2014-05-21 DIAGNOSIS — E785 Hyperlipidemia, unspecified: Secondary | ICD-10-CM

## 2014-05-21 DIAGNOSIS — I2510 Atherosclerotic heart disease of native coronary artery without angina pectoris: Secondary | ICD-10-CM

## 2014-05-21 DIAGNOSIS — I25119 Atherosclerotic heart disease of native coronary artery with unspecified angina pectoris: Secondary | ICD-10-CM

## 2014-05-21 DIAGNOSIS — I209 Angina pectoris, unspecified: Secondary | ICD-10-CM

## 2014-05-21 DIAGNOSIS — Z87891 Personal history of nicotine dependence: Secondary | ICD-10-CM

## 2014-05-21 NOTE — Progress Notes (Signed)
Patient ID: Sophia Briggs, female    DOB: 04/12/1946, 68 y.o.   MRN: [id number]  HPI Comments: Sophia Briggs is a pleasant 68 year old woman with a history of coronary artery disease, previous stent x2 in 2009 to her LAD, hypertension, hyperlipidemia, tobacco abuse, history of TIA who presented to the hospital 04/16/2013 with chest pain. Initial troponin 0.6 with MB greater than 5. She had stuttering chest pain before coming into the hospital. Cardiac catheterization showed severe ostial D2 and D3 disease. The D2 ostium was at the distal end of the LAD stent. LAD stent was patent. D3 was a small vessel. Ejection fraction 55%. No intervention was performed. Recommendations made after discussion with interventional cardiology was to manage medically.  She has had significant stress at home in the past, as her daughter is sick, has a tracheostomy tube, previous stroke.   In followup today, she reports that she is doing well.  She denies any significant chest pain symptoms. She is relatively active. Tolerating her medications without any problems. She's not very regular exercise program. No lower extremity swelling  EKG from shows normal sinus rhythm with rate 63 beats per minute, left bundle branch block   Outpatient Encounter Prescriptions as of 05/21/2014  Medication Sig  . aspirin 81 MG tablet Take 162 mg by mouth daily.   . carvedilol (COREG) 25 MG tablet Take 25 mg by mouth 2 (two) times daily with a meal.  . cholecalciferol (VITAMIN D) 1000 UNITS tablet Take 1,000 Units by mouth 2 (two) times daily.  . Fluticasone-Salmeterol (ADVAIR) 250-50 MCG/DOSE AEPB Inhale 1 puff into the lungs every 12 (twelve) hours.  . isosorbide mononitrate (IMDUR) 30 MG 24 hr tablet Take 30 mg by mouth daily.  Marland Kitchen lisinopril (PRINIVIL,ZESTRIL) 10 MG tablet Take 10 mg by mouth daily.  . nitroGLYCERIN (NITROSTAT) 0.4 MG SL tablet Place 0.4 mg under the tongue every 5 (five) minutes as needed.  Marland Kitchen  oxyCODONE-acetaminophen (PERCOCET/ROXICET) 5-325 MG per tablet Take 1 tablet by mouth every 4 (four) hours as needed.   . pantoprazole (PROTONIX) 40 MG tablet Take 40 mg by mouth daily.  . rosuvastatin (CRESTOR) 5 MG tablet Take 5 mg by mouth daily.  . sertraline (ZOLOFT) 50 MG tablet Take 50 mg by mouth as needed.     Review of Systems  Constitutional: Negative.   HENT: Negative.   Eyes: Negative.   Respiratory: Negative.   Cardiovascular: Negative.   Gastrointestinal: Negative.   Endocrine: Negative.   Musculoskeletal: Negative.   Skin: Negative.   Allergic/Immunologic: Negative.   Neurological: Negative.   Hematological: Negative.   Psychiatric/Behavioral: Negative.   All other systems reviewed and are negative.   BP 120/90  Pulse 63  Ht 5\' 7"  (1.702 m)  Wt 193 lb 8 oz (87.771 kg)  BMI 30.30 kg/m2  Physical Exam  Nursing note and vitals reviewed. Constitutional: She is oriented to person, place, and time. She appears well-developed and well-nourished.  HENT:  Head: Normocephalic.  Nose: Nose normal.  Mouth/Throat: Oropharynx is clear and moist.  Eyes: Conjunctivae are normal. Pupils are equal, round, and reactive to light.  Neck: Normal range of motion. Neck supple. No JVD present.  Cardiovascular: Normal rate, regular rhythm, S1 normal, S2 normal, normal heart sounds and intact distal pulses.  Exam reveals no gallop and no friction rub.   No murmur heard. Pulmonary/Chest: Effort normal and breath sounds normal. No respiratory distress. She has no wheezes. She has no rales. She exhibits no tenderness.  Abdominal: Soft. Bowel sounds are normal. She exhibits no distension. There is no tenderness.  Musculoskeletal: Normal range of motion. She exhibits no edema and no tenderness.  Lymphadenopathy:    She has no cervical adenopathy.  Neurological: She is alert and oriented to person, place, and time. Coordination normal.  Skin: Skin is warm and dry. No rash noted. No  erythema.  Psychiatric: She has a normal mood and affect. Her behavior is normal. Judgment and thought content normal.    Assessment and Plan

## 2014-05-21 NOTE — Assessment & Plan Note (Signed)
Encouraged her to stay on her Crestor 5 mg daily. She will followup with her primary care physician for routine cholesterol check. Goal LDL less than 70, total cholesterol less than 150

## 2014-05-21 NOTE — Assessment & Plan Note (Signed)
She denies that she is currently smoking.

## 2014-05-21 NOTE — Patient Instructions (Addendum)
You are doing well. No medication changes were made.  Goal total cholesterol is <150 Goal LDL <70  Please call us if you have new issues that need to be addressed before your next appt.  Your physician wants you to follow-up in: 12 months.  You will receive a reminder letter in the mail two months in advance. If you don't receive a letter, please call our office to schedule the follow-up appointment.

## 2014-05-21 NOTE — Assessment & Plan Note (Signed)
Currently with no symptoms of angina. No further workup at this time. Continue current medication regimen. 

## 2015-01-22 ENCOUNTER — Telehealth: Payer: Self-pay | Admitting: Gastroenterology

## 2015-01-22 NOTE — Telephone Encounter (Signed)
I called medical records at [facility] to retrieve tcs/path report and will give to triage nurse for review.

## 2015-01-22 NOTE — Telephone Encounter (Signed)
PT REPORTS PERSONAL HISTORY OF POLYPS. DESIRES TCS. OBTAIN TCS REPORT FROM [facility]. WILL NEED TO BE TRIAGED FOR TCS AFTER TCS [place] REVIEWED-FULL LIQUID DIET THE DAY BEFORE/ SUPREP.

## 2015-01-23 NOTE — Discharge Summary (Signed)
PATIENT NAME:  Sophia Briggs, Sophia Briggs MR#:  [id number] DATE OF BIRTH:  [birth year]  DATE OF ADMISSION:  04/16/2013 DATE OF DISCHARGE:  04/16/2013  ADDENDUM  The patient's heart rate is around 48 to 57 beats per minute.  Coreg 12.5 twice daily instead of 25 twice daily. Also noted, maybe mild thrombocytopenia, platelets of 110.  The patient advised to appreciate recheck the platelet count in about 2 weeks. Also was advised to avoid ibuprofen containing products.  ____________________________ Epifanio Lesches, MD sk:rw D: 04/17/2013 12:10:01 ET T: 04/17/2013 12:47:56 ET JOB#: [id number]  cc: Epifanio Lesches, MD, <Dictator> Epifanio Lesches MD ELECTRONICALLY SIGNED 04/24/2013 22:29

## 2015-01-23 NOTE — Consult Note (Signed)
General Aspect 69 year old female with coronary artery disease, status post cardiac stents x 2 in 2009, hypertension, hyperlipidemia, history of TIA and tobacco abuse, who presents with complaint of chest pain. Cardiology was consulted for NSTEMI, troponin to 0.6, elevated CKMB >5.  Chest pain started last night at 9 pm. She took NTG x 4 with mild improvement, pain came back again.  The patient reports she has been having episodes of chest pain occasionally, once or twice every year. Chest pain was nonradiating, with  some mild nausea and vomiting, but she denies any sweating, shortness of breath or palpitations.    In the ED, the patient was found to be hypertensive, which improved with nitro paste. The patient had negative troponin x 1. Second troponin with elevation and cardiology was consulted.   Her previous records were already requested from [facility].  The patient received 324 of aspirin in the ED. She denies any cough, any productive sputum.    PAST MEDICAL HISTORY:  1. Coronary artery disease, status post stent x 2 in 2009.  2. Hypertension.  3. Hyperlipidemia.  4. History of TIA.  5. Tobacco abuse.   Present Illness . PAST SURGICAL HISTORY:  1. Back surgery.  2. Cardiac stent x 2.   FAMILY HISTORY: Significant for coronary artery disease in her brother.   SOCIAL HISTORY: Used to work at post office, was on disability, currently retired. Still smokes 1/2-pack per day. No illicit drugs. No alcohol abuse.   Physical Exam:  GEN well developed, well nourished, no acute distress   HEENT red conjunctivae   NECK supple  No masses   RESP normal resp effort  clear BS   CARD Regular rate and rhythm  Bradycardic  Normal, S1, S2   ABD denies tenderness  soft   LYMPH negative neck   EXTR negative edema   SKIN normal to palpation   NEURO motor/sensory function intact   PSYCH alert, A+O to time, place, person, good insight   Review of Systems:  Subjective/Chief Complaint  Chest pain, now resolved   General: No Complaints   Skin: No Complaints   ENT: No Complaints   Eyes: No Complaints   Neck: No Complaints   Respiratory: No Complaints   Cardiovascular: Chest pain or discomfort   Gastrointestinal: No Complaints   Genitourinary: No Complaints   Vascular: No Complaints   Musculoskeletal: No Complaints   Neurologic: No Complaints   Hematologic: No Complaints   Endocrine: No Complaints   Psychiatric: No Complaints   Review of Systems: All other systems were reviewed and found to be negative   Medications/Allergies Reviewed Medications/Allergies reviewed     Hypercholesterolemia:    Hypertension:    CVA:    MI:    card stent:        Admit Diagnosis:   CHEST PAIN: Onset Date: 16-Apr-2013, Status: Active, Description: CHEST PAIN  Home Medications: Medication Instructions Status  Coreg 25 mg oral tablet 1 tab(s) orally 2 times a day Active  Crestor 5 mg oral tablet 1 tab(s) orally once a day (at bedtime) Active  isosorbide mononitrate 30 mg oral tablet, extended release 1 tab(s) orally once a day (in the morning) Active  ibuprofen 800 mg oral tablet 1 tab(s) orally 3 times a day, As Needed - for Pain Active  Vitamin D3 2000 intl units oral capsule 1 cap(s) orally once a day Active  pantoprazole 40 mg oral granule, enteric coated 1 each orally once a day Active  Bayer Aspirin 81  mg oral tablet, chewable 1 tab(s) orally once a day Active  Nitrostat 0.4 mg sublingual tablet 1 tab(s) sublingual every 5 minutes Active   Lab Results:  Routine Chem:  15-Jul-14 09:09   Result Comment TROPONIN - RESULTS VERIFIED BY REPEAT TESTING.  - C/ABIGAIL [email or web address]  - READ-BACK PROCESS PERFORMED.  Result(s) reported on 16 Apr 2013 at 10:06AM.  Cardiac:  15-Jul-14 09:09   CK, Total 162  CPK-MB, Serum  5.2 (Result(s) reported on 16 Apr 2013 at 09:52AM.)  Troponin I  0.67 (0.00-0.05 0.05 ng/mL or less: NEGATIVE  Repeat testing  in 3-6 hrs  if clinically indicated. >0.05 ng/mL: POTENTIAL  MYOCARDIAL INJURY. Repeat  testing in 3-6 hrs if  clinically indicated. NOTE: An increase or decrease  of 30% or more on serial  testing suggests a  clinically important change)   EKG:  Interpretation EKG shows NSR with rate 65 bpm, LBBB   Radiology Results: XRay:    15-Jul-14 01:50, Chest PA and Lateral  Chest PA and Lateral   REASON FOR EXAM:    cp  COMMENTS:   May transport without cardiac monitor    PROCEDURE: DXR - DXR CHEST PA (OR AP) AND LATERAL  - Apr 16 2013  1:50AM     RESULT: Comparison: None    Findings:     PA and lateral chest radiographs are provided.  There is no focal   parenchymal opacity, pleural effusion, or pneumothorax. The heart and   mediastinum are unremarkable.  The osseous structures are unremarkable.    IMPRESSION:   No acute disease of the chest.    Dictation Site: 1        Verified By: Jennette Banker, M.D., MD    Amlodipine: Other  Vital Signs/Nurse's Notes: **Vital Signs.:   15-Jul-14 09:06  Vital Signs Type Pre Medication  Temperature Temperature (F) 97.5  Celsius 36.3  Pulse Pulse 64  Respirations Respirations 16  Systolic BP Systolic BP 235  Diastolic BP (mmHg) Diastolic BP (mmHg) 93  Mean BP 124  Pulse Ox % Pulse Ox % 100  Pulse Ox Activity Level  At rest  Oxygen Delivery Room Air/ 21 %    Impression 69 year old female with coronary artery disease, status post cardiac stents x 2 in 2009, hypertension, hyperlipidemia, history of TIA and tobacco abuse, who presents with complaint of chest pain. Cardiology was consulted for NSTEMI, troponin to 0.6, elevated CKMB >5.  1) NSTEMI: Known CAD, previous stent x 2 in 2009. Now presenting with angina, elevated troponin on second lab draw. Currently pain free, Received lovenox at 3:30 Am, now on hold on IVF, Plan for cardiac cath, today if schedule permits, she has been NPO. COntinue aspirin, b-blocker,  statin  2)Hyperlipidemia continue crestor daily  3) Smoker: Discussed cessation, she has patch in place   Electronic Signatures: Ida Rogue (MD)  (Signed 15-Jul-14 13:28)  Authored: General Aspect/Present Illness, History and Physical Exam, Review of System, Past Medical History, Health Issues, Home Medications, Labs, EKG , Radiology, Allergies, Vital Signs/Nurse's Notes, Impression/Plan   Last Updated: 15-Jul-14 13:28 by Ida Rogue (MD)

## 2015-01-23 NOTE — Discharge Summary (Signed)
PATIENT NAME:  Sophia Briggs, Sophia Briggs MR#:  [id number] DATE OF BIRTH:  Feb 22, 1946  DATE OF ADMISSION:  04/16/2013 DATE OF DISCHARGE:  04/17/2013   DISCHARGE DIAGNOSES:  1. Chest pain with non-ST-elevation myocardial infarction.  2. Hyperlipidemia.  3. Tobacco abuse.  4. History of transient ischemic attack.   DISCHARGE MEDICATIONS:  1. Aspirin 162 mg p.o. daily. 2. Isosorbide 30 mg p.o. daily.  3. Coreg 25 mg p.o. b.i.d.  4. Crestor 5 mg p.o. daily. 5. Pantoprazole 40 mg p.o. daily.   LABORATORY DATA: EKG: Normal sinus at 65 beats on admission. INR 1. UA is clean. Troponin initially 0.02, peaked up to 0.67 and then 1.20. The patient's CK also total peaked up to 5.2. The patient CPK-MB peaked up to 6.4. Electrolytes and kidney: The patient's BUN and creatinine: BUN 14, creatinine 0.83, and also CBC within normal limits with WBC 8.4, hemoglobin 13.5, hematocrit 41, platelets slightly low at 112. The patient's lipase 131.   PROCEDURES: The patient had a cardiac catheter done with Dr. Esmond Plants which showed discrete 90% stenosis at the ostium of the basal second diagonal, and at the third diagonal, there is a 90% stenosis at the ostium of the basal segment. The patient is advised medical management.   HOSPITAL COURSE: A 69 year old female patient with history of coronary artery disease with stents 2 times and had been on Plavix for a year in 2009, came in because of chest pain. The patient's initial troponins have been negative. The patient was admitted to observation status for chest pain. EKG did not show any changes. The patient has past medical history of stents, so we have monitored the troponins, but the second troponin and third troponin peaked up. I asked the cardiologist to see the patient. Dr. Esmond Plants has seen the patient. Initially, the patient started on aspirin, beta blockers, statins and nitroglycerin, and the patient is taken to the cardiac catheterization because of non-ST-elevation  MI. The patient's cardiac catheterization showed severe ostial diagonal 2 disease at the distal end of stent and also severe ostial diagonal 3 disease. Dr. Rockey Situ says it is very difficult to fix D2 ostium as he would have to go through the stent, and D3 disease is very small to fix. The patient advised to do the medical management with aspirin, beta blockers, statins. The patient will follow with her cardiologist in [place]. If not, the patient can follow with Dr. Esmond Plants.   The patient's other diagnoses include hypertension, hyperlipidemia, history of TIA, tobacco abuse. The patient's condition is stable. Of note, her blood pressure is staying at 150/91, so I have added lisinopril. The patient can continue lisinopril also at 10 mg daily.   TIME SPENT: More than 30 minutes.  ____________________________ Epifanio Lesches, MD sk:OSi D: 04/17/2013 11:54:18 ET T: 04/17/2013 12:06:08 ET JOB#: [id number]  cc: Epifanio Lesches, MD, <Dictator> Minna Merritts, MD Epifanio Lesches MD ELECTRONICALLY SIGNED 04/24/2013 22:29

## 2015-01-23 NOTE — H&P (Signed)
PATIENT NAME:  Sophia Briggs, PLAISTED MR#:  [id number] DATE OF BIRTH:  1945-12-21  DATE OF ADMISSION:  04/16/2013  REFERRING PHYSICIAN: Shirley Friar. Braud, MD  PRIMARY CARE PHYSICIAN: Following with primary care physician at [facility].   CHIEF COMPLAINT: Chest pain.   HISTORY OF PRESENT ILLNESS: This is a 69 year old female with significant past medical history of coronary artery disease, status post cardiac stents x 2 in 2009, hypertension, hyperlipidemia, history of TIA and tobacco abuse, who presents with complaint of chest pain. The patient reports she has been having episodes of chest pain occasionally, once or twice every year, but she reports she has not been following with her cardiologist over the last 2 to 3 years. She presents this evening after she had episode of chest pain, midsternal, nonradiating, as well as accompanied by some mild nausea and vomiting, but she denies any sweating, shortness of breath or palpitations. Reports she took 4 of sublingual nitroglycerin, which did help to improve her chest pain. As well, in the ED, the patient was found to be hypertensive, which improved with nitro paste. The patient had negative troponin x 1. Her EKG showing left bundle branch block, but this is the patient's first visit to the ED, where we were unable to compare her previous record. Her previous records were already requested from [facility]. The patient received 324 of aspirin in the ED. The patient currently denies any chest pain. As well, reports that her chest pain did improve with burping as well. Denies any cough, any productive sputum. Hospitalist service was requested to admit the patient for further management and workup for her chest pain.   PAST MEDICAL HISTORY:  1. Coronary artery disease, status post stent x 2 in 2009.  2. Hypertension.  3. Hyperlipidemia.  4. History of TIA.  5. Tobacco abuse.   PAST SURGICAL HISTORY:  1. Back surgery.  2. Cardiac stent x 2.   FAMILY HISTORY:  Significant for coronary artery disease in her brother.   SOCIAL HISTORY: Used to work at post office, was on disability, currently retired. Still smokes 1/2-pack per day. No illicit drugs. No alcohol abuse.   REVIEW OF SYSTEMS:  CONSTITUTIONAL: Denies fever, chills, fatigue, weakness, weight gain, weight loss.  EYES: Denies blurry vision, double vision, inflammation, glaucoma.  ENT: Denies tinnitus, ear pain, epistaxis or discharge.  RESPIRATORY: Denies cough, wheezing, hemoptysis, dyspnea, COPD.  CARDIOVASCULAR: Complains of chest pain. Denies edema, arrhythmia, palpitations, syncope.  GASTROINTESTINAL: Complains of nausea and vomiting x 1. Denies diarrhea, abdominal pain, melena, rectal bleed, jaundice. GENITOURINARY: Denies dysuria, hematuria, renal colic. ENDOCRINE: Denies polyuria, polydipsia, heat or cold intolerance.  HEMATOLOGY: Denies anemia, easy bruising or bleeding diathesis.  INTEGUMENT: Denies acne, rash or skin lesions.  MUSCULOSKELETAL: Denies any neck pain, knee pain, arthritis, cramps, gout.  NEUROLOGIC: Denies ataxia, dementia, headache, epilepsy, dysarthria, weakness.  PSYCHIATRIC: Denies anxiety, insomnia, substance abuse, alcohol abuse, bipolar disorder or depression.   ALLERGIES: AMLODIPINE.   HOME MEDICATIONS:  1. Aspirin 81 mg daily.  2. Ibuprofen 800 mg 3 times a day as needed.  3. Isosorbide mononitrate 30 mg oral daily.  4. Sublingual nitroglycerin as needed.  5. Crestor 5 mg oral at bedtime.  6. Coreg 25 mg p.o. b.i.d.  7. Protonix 40 mg oral daily.  8. Vitamin D3 2000 international units oral daily.   PHYSICAL EXAMINATION:  VITAL SIGNS: Temperature 97, pulse 64, respiratory rate 16, blood pressure 148/80, saturating 97% on room air.  GENERAL: Well-nourished female who looks comfortable in bed,  in no apparent distress.  HEENT: Head atraumatic, normocephalic. Pupils equally reactive to light. Pink conjunctivae. Anicteric sclerae. Moist oral mucosa.   NECK: Supple. No thyromegaly. No JVD.  CHEST: Good air entry bilaterally. No wheezing, rales or rhonchi.  CARDIOVASCULAR: S1, S2 heard. No rubs, murmurs or gallops. No reproducible chest pain on palpation.  ABDOMEN: Soft, nontender, nondistended. Bowel sounds present.  EXTREMITIES: No edema. No clubbing. No cyanosis.  PSYCHIATRIC: Appropriate affect. Awake and alert x 3. Intact judgment and insight.  NEUROLOGICAL: Cranial nerves grossly intact. Motor 5 out of 5. Sensation intact to light touch.  LYMPHATICS: No cervical or axillary lymphadenopathy.    PERTINENT LABORATORY DATA: Glucose 117, BUN 14, creatinine 0.83, sodium 138, potassium 4.1, chloride 106, CO2 26, ALT 23, AST 29, alkaline phosphatase 91. Troponin less than 0.02. White blood cells 8.7, hemoglobin 13.5, hematocrit 41, platelets 112. INR 1. Urinalysis negative for leukocyte esterase and nitrite. EKG showing normal sinus rhythm with left bundle branch block at 65 beats per minute.   ASSESSMENT AND PLAN:  1. Chest pain. The patient is currently chest pain free, but given her significant history and chest pain resolved by sublingual nitroglycerin, she will be admitted to telemetry unit. Will continue to cycle her cardiac enzymes, and if negative, will schedule her for a stress test given her history, even though her presentation appears to be atypical as she had some relief, with burping, of her chest pain. She already received 324 of aspirin and will give her 1 dose of Lovenox 1 mg/kg until her second troponin cycles and ACS is ruled out.  2. History of coronary artery disease. Continue the patient on aspirin, statin, beta blockers and Imdur.  3. Hypertension, currently much improved after nitro paste. Continue with home medications.  4. Hyperlipidemia. Continue with statin.  5. History of transient ischemic attack. Continue with aspirin.  6. Tobacco abuse. The patient was counseled 3 minutes. Will start her on NicoDerm patch.  7. Deep  vein thrombosis prophylaxis. The patient already received full dose anticoagulation x 1 dose until ACS is ruled out.   CODE STATUS: The patient is full code.   TOTAL TIME SPENT ON ADMISSION AND PATIENT CARE: 55 minutes.   ____________________________ Albertine Patricia, MD dse:OSi D: 04/16/2013 03:34:00 ET T: 04/16/2013 07:22:57 ET JOB#: [id number]  cc: Albertine Patricia, MD, <Dictator> Abass Misener Graciela Husbands MD ELECTRONICALLY SIGNED 04/18/2013 4:57

## 2015-01-28 NOTE — Telephone Encounter (Signed)
Received the colonoscopy procedure report from [facility]. Colonoscopy done by Dr. Anthony Sar on 05/21/2008. Indications: personal hx of colonic polyps ( not described).  This report Impression: 1. No colonic or rectal polyps 2. Normal colonoscopy  No recommendation for timing of next colonoscopy.  Please advise!

## 2015-02-03 ENCOUNTER — Telehealth: Payer: Self-pay

## 2015-02-03 NOTE — Telephone Encounter (Signed)
Per Dr. Oneida Alar, triage pt for colonoscopy for personal hx of polyps.  Called , LMOM to return call.

## 2015-02-03 NOTE — Telephone Encounter (Signed)
See separate phone note. Called pt and LMOM for a return call.

## 2015-02-03 NOTE — Telephone Encounter (Signed)
TRIAGE FOR TCS IN 2016 DUE TO PERSONAL HISTORY OF POLYPS.

## 2015-02-04 NOTE — Telephone Encounter (Signed)
Letter mailed to pt to call.  

## 2015-08-05 ENCOUNTER — Encounter: Payer: Self-pay | Admitting: Cardiovascular Disease

## 2015-08-05 ENCOUNTER — Ambulatory Visit (INDEPENDENT_AMBULATORY_CARE_PROVIDER_SITE_OTHER): Payer: Medicare HMO | Admitting: Cardiovascular Disease

## 2015-08-05 VITALS — BP 108/80 | HR 65 | Ht 67.0 in | Wt 187.8 lb

## 2015-08-05 DIAGNOSIS — I25119 Atherosclerotic heart disease of native coronary artery with unspecified angina pectoris: Secondary | ICD-10-CM

## 2015-08-05 DIAGNOSIS — I209 Angina pectoris, unspecified: Secondary | ICD-10-CM | POA: Diagnosis not present

## 2015-08-05 DIAGNOSIS — Z87891 Personal history of nicotine dependence: Secondary | ICD-10-CM

## 2015-08-05 DIAGNOSIS — E785 Hyperlipidemia, unspecified: Secondary | ICD-10-CM

## 2015-08-05 DIAGNOSIS — Z720 Tobacco use: Secondary | ICD-10-CM

## 2015-08-05 NOTE — Progress Notes (Signed)
Patient ID: Sophia Briggs, female    DOB: 11-01-1945, 69 y.o.   MRN: [id number]  HPI Comments: Sophia Briggs is a pleasant 69 year old woman with a history of coronary artery disease, previous stent x2 in 2009 to her LAD, hypertension, hyperlipidemia, tobacco abuse, history of TIA who presented to the hospital 04/16/2013 with chest pain. Initial troponin 0.6 with MB greater than 5. She had stuttering chest pain before coming into the hospital. Cardiac catheterization showed severe ostial D2 and D3 disease. The D2 ostium was at the distal end of the LAD stent. LAD stent was patent. D3 was a small vessel. Ejection fraction 55%. No intervention was performed. Recommendations made after discussion with interventional cardiology was to manage medically. She presents today for follow-up of her coronary artery disease  In follow-up, she reports that she is doing well. She denies any angina symptoms, no chest pain, no shortness of breath, no leg edema Reports that she is trying to lose weight Helping to take care of her elderly mother Tolerating her medications No regular walking program but is active Recent blood work through primary care showing total cholesterol 115, LDL 48  EKG on today's visit shows normal sinus rhythm with left bundle branch block, rate 65 bpm  Other past medical history Prior significant stress at home in the past, as her daughter is sick, has a tracheostomy tube, previous stroke.     Allergies  Allergen Reactions  . Clopidogrel Bisulfate     Other reaction(s): Other (See Comments) Other Reaction: blood too thin-had stroke  . Amlodipine Other (See Comments)  . Atorvastatin     Other reaction(s): Muscle Pain Pain in knees    Current Outpatient Prescriptions on File Prior to Visit  Medication Sig Dispense Refill  . aspirin 81 MG tablet Take 162 mg by mouth daily.     . carvedilol (COREG) 25 MG tablet Take 25 mg by mouth 2 (two) times daily with a meal.    .  cholecalciferol (VITAMIN D) 1000 UNITS tablet Take 1,000 Units by mouth 2 (two) times daily.    . isosorbide mononitrate (IMDUR) 30 MG 24 hr tablet Take 30 mg by mouth daily.    Marland Kitchen lisinopril (PRINIVIL,ZESTRIL) 10 MG tablet Take 10 mg by mouth daily.    . nitroGLYCERIN (NITROSTAT) 0.4 MG SL tablet Place 0.4 mg under the tongue every 5 (five) minutes as needed.    Marland Kitchen oxyCODONE-acetaminophen (PERCOCET/ROXICET) 5-325 MG per tablet Take 1 tablet by mouth every 4 (four) hours as needed.     . pantoprazole (PROTONIX) 40 MG tablet Take 40 mg by mouth daily.    . rosuvastatin (CRESTOR) 5 MG tablet Take 5 mg by mouth daily.     No current facility-administered medications on file prior to visit.    Past Medical History  Diagnosis Date  . Coronary artery disease   . Back pain   . Hypertension   . TIA (transient ischemic attack)   . Acute MI ([place])   . Stroke ([place])   . Syncope and collapse     Past Surgical History  Procedure Laterality Date  . Cardiac catheterization      stent placement 2006 at [facility]   . Tubal ligation    . Parotidectomy      Social History  reports that she quit smoking about 2 years ago. Her smoking use included Cigarettes. She has a 10 pack-year smoking history. She does not have any smokeless tobacco history on file. She  reports that she does not drink alcohol or use illicit drugs.  Family History She was adopted. Family history is unknown by patient.  Review of Systems  Constitutional: Negative.   Respiratory: Negative.   Cardiovascular: Negative.   Gastrointestinal: Negative.   Musculoskeletal: Negative.   Neurological: Negative.   Hematological: Negative.   Psychiatric/Behavioral: Negative.   All other systems reviewed and are negative.   BP 108/80 mmHg  Pulse 65  Ht 5\' 7"  (1.702 m)  Wt 187 lb 12 oz (85.163 kg)  BMI 29.40 kg/m2  Physical Exam  Constitutional: She is oriented to person, place, and time. She appears well-developed and  well-nourished.  HENT:  Head: Normocephalic.  Nose: Nose normal.  Mouth/Throat: Oropharynx is clear and moist.  Eyes: Conjunctivae are normal. Pupils are equal, round, and reactive to light.  Neck: Normal range of motion. Neck supple. No JVD present.  Cardiovascular: Normal rate, regular rhythm, S1 normal, S2 normal, normal heart sounds and intact distal pulses.  Exam reveals no gallop and no friction rub.   No murmur heard. Pulmonary/Chest: Effort normal and breath sounds normal. No respiratory distress. She has no wheezes. She has no rales. She exhibits no tenderness.  Abdominal: Soft. Bowel sounds are normal. She exhibits no distension. There is no tenderness.  Musculoskeletal: Normal range of motion. She exhibits no edema or tenderness.  Lymphadenopathy:    She has no cervical adenopathy.  Neurological: She is alert and oriented to person, place, and time. Coordination normal.  Skin: Skin is warm and dry. No rash noted. No erythema.  Psychiatric: She has a normal mood and affect. Her behavior is normal. Judgment and thought content normal.    Assessment and Plan  Nursing note and vitals reviewed.

## 2015-08-05 NOTE — Assessment & Plan Note (Signed)
We have encouraged her to continue to work on weaning her cigarettes and smoking cessation. She will continue to work on this and does not want any assistance with chantix.  

## 2015-08-05 NOTE — Patient Instructions (Signed)
You are doing well. No medication changes were made.  Please call us if you have new issues that need to be addressed before your next appt.  Your physician wants you to follow-up in: 12 months.  You will receive a reminder letter in the mail two months in advance. If you don't receive a letter, please call our office to schedule the follow-up appointment. 

## 2015-08-05 NOTE — Assessment & Plan Note (Signed)
Denies any angina at this time. No further workup indicated, stable

## 2015-08-05 NOTE — Assessment & Plan Note (Signed)
Cholesterol is at goal on the current lipid regimen. No changes to the medications were made.  

## 2015-08-05 NOTE — Assessment & Plan Note (Signed)
Currently with no symptoms of angina. No further workup at this time. Continue current medication regimen. 

## 2015-08-25 DIAGNOSIS — J40 Bronchitis, not specified as acute or chronic: Secondary | ICD-10-CM | POA: Diagnosis not present

## 2015-08-25 DIAGNOSIS — R69 Illness, unspecified: Secondary | ICD-10-CM | POA: Diagnosis not present

## 2015-09-08 DIAGNOSIS — Z1231 Encounter for screening mammogram for malignant neoplasm of breast: Secondary | ICD-10-CM | POA: Diagnosis not present

## 2015-09-08 DIAGNOSIS — Z1211 Encounter for screening for malignant neoplasm of colon: Secondary | ICD-10-CM | POA: Diagnosis not present

## 2015-09-08 DIAGNOSIS — Z124 Encounter for screening for malignant neoplasm of cervix: Secondary | ICD-10-CM | POA: Diagnosis not present

## 2015-09-08 DIAGNOSIS — Z0000 Encounter for general adult medical examination without abnormal findings: Secondary | ICD-10-CM | POA: Diagnosis not present

## 2015-09-08 DIAGNOSIS — E7801 Familial hypercholesterolemia: Secondary | ICD-10-CM | POA: Diagnosis not present

## 2015-09-08 DIAGNOSIS — E559 Vitamin D deficiency, unspecified: Secondary | ICD-10-CM | POA: Diagnosis not present

## 2015-09-08 DIAGNOSIS — I10 Essential (primary) hypertension: Secondary | ICD-10-CM | POA: Diagnosis not present

## 2015-10-20 DIAGNOSIS — Z1231 Encounter for screening mammogram for malignant neoplasm of breast: Secondary | ICD-10-CM | POA: Diagnosis not present

## 2015-10-20 DIAGNOSIS — Z124 Encounter for screening for malignant neoplasm of cervix: Secondary | ICD-10-CM | POA: Diagnosis not present

## 2015-10-20 DIAGNOSIS — Z23 Encounter for immunization: Secondary | ICD-10-CM | POA: Diagnosis not present

## 2015-10-20 DIAGNOSIS — E559 Vitamin D deficiency, unspecified: Secondary | ICD-10-CM | POA: Diagnosis not present

## 2015-10-20 DIAGNOSIS — Z1211 Encounter for screening for malignant neoplasm of colon: Secondary | ICD-10-CM | POA: Diagnosis not present

## 2015-10-20 DIAGNOSIS — Z0000 Encounter for general adult medical examination without abnormal findings: Secondary | ICD-10-CM | POA: Diagnosis not present

## 2015-10-20 DIAGNOSIS — E7801 Familial hypercholesterolemia: Secondary | ICD-10-CM | POA: Diagnosis not present

## 2015-10-20 DIAGNOSIS — I10 Essential (primary) hypertension: Secondary | ICD-10-CM | POA: Diagnosis not present

## 2016-06-22 DIAGNOSIS — Z23 Encounter for immunization: Secondary | ICD-10-CM | POA: Diagnosis not present

## 2017-06-22 ENCOUNTER — Telehealth: Payer: Self-pay

## 2017-06-22 ENCOUNTER — Ambulatory Visit (INDEPENDENT_AMBULATORY_CARE_PROVIDER_SITE_OTHER): Payer: Medicare HMO | Admitting: Family Medicine

## 2017-06-22 ENCOUNTER — Encounter: Payer: Self-pay | Admitting: Family Medicine

## 2017-06-22 ENCOUNTER — Telehealth: Payer: Self-pay | Admitting: Family Medicine

## 2017-06-22 VITALS — BP 136/86 | HR 64 | Temp 97.4°F | Resp 18 | Ht 67.0 in | Wt 179.0 lb

## 2017-06-22 DIAGNOSIS — Z23 Encounter for immunization: Secondary | ICD-10-CM

## 2017-06-22 DIAGNOSIS — F17210 Nicotine dependence, cigarettes, uncomplicated: Secondary | ICD-10-CM

## 2017-06-22 DIAGNOSIS — E785 Hyperlipidemia, unspecified: Secondary | ICD-10-CM

## 2017-06-22 DIAGNOSIS — Z1159 Encounter for screening for other viral diseases: Secondary | ICD-10-CM

## 2017-06-22 DIAGNOSIS — I10 Essential (primary) hypertension: Secondary | ICD-10-CM

## 2017-06-22 DIAGNOSIS — I2510 Atherosclerotic heart disease of native coronary artery without angina pectoris: Secondary | ICD-10-CM | POA: Diagnosis not present

## 2017-06-22 DIAGNOSIS — Z5320 Procedure and treatment not carried out because of patient's decision for unspecified reasons: Secondary | ICD-10-CM

## 2017-06-22 DIAGNOSIS — Z112 Encounter for screening for other bacterial diseases: Secondary | ICD-10-CM

## 2017-06-22 DIAGNOSIS — Z720 Tobacco use: Secondary | ICD-10-CM

## 2017-06-22 NOTE — Telephone Encounter (Signed)
Patient requesting a Rx for cough syrup.  Please call in @ [facility]

## 2017-06-22 NOTE — Telephone Encounter (Signed)
OTC delsym per box instructions

## 2017-06-22 NOTE — Telephone Encounter (Signed)
She will still pay for a Rx.  OTC is better

## 2017-06-22 NOTE — Telephone Encounter (Signed)
Done

## 2017-06-22 NOTE — Progress Notes (Signed)
Chief Complaint  Patient presents with  . Coronary Artery Disease   New patient Old cardiology note is available, and prior orthopedic visits from 2015 Old PCP notes requested Noncompliant heart patient who continues to smoke and does not follow up with cardiology.  Has cardiac stents.  Takes baby aspirin.  She does take her medicine.  fortunately does not have chest pain.  Is not active to have DOE.  No shortness of breath when sedentary She has chronic back pain from spinal stenosis.  She sees Dr Oleta Mouse in orthopedics at [facility].  She is on oxycodone BID.  Says she does not need it every day but consistently picks up 60 per month.  [facility] database reviewed.  She is informed I do not do chronic pain management and will need to go back to Dr Oleta Mouse. I have discussed the multiple health risks associated with cigarette smoking including, but not limited to, cardiovascular disease, lung disease and cancer.  I have strongly recommended that smoking be stopped.  I have reviewed the various methods of quitting including cold [place], classes, nicotine replacements and prescription medications.  I have offered assistance in this difficult process.  The patient is not interested in assistance at this time. We spent 5 min in discussion in quitting, health risks, choices. She had an abnormal mammogram in the past, and it "scared her".  She refuses to go for another She had polyps on prior colonoscopy and repeat was advised for her in 2016 by Dr Oneida Alar.  She refuses.  I discussed that she is at increased risk of colon cancer with smoking and polyp history. Takes Protonix with good control GERD    Patient Active Problem List   Diagnosis Date Noted  . Tobacco abuse 06/22/2017  . Angina pectoris ([place]) 08/05/2015  . Lumbar radiculopathy 10/11/2013  . 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 CAD (coronary artery disease) 05/06/2013  . Chest pain 05/06/2013  . Hyperlipidemia 05/06/2013  . Smoking history 05/06/2013   . CTS (carpal tunnel syndrome) 01/06/2012  . HLD (hyperlipidemia) 04/13/2009  . THROMBOCYTOPENIA 04/13/2009  . TRANSIENT ISCHEMIC ATTACK 04/13/2009  . EPISTAXIS 04/13/2009    Outpatient Encounter Prescriptions as of 06/22/2017  Medication Sig  . aspirin 81 MG tablet Take 81 mg by mouth daily.  . carvedilol (COREG) 25 MG tablet Take 25 mg by mouth 2 (two) times daily with a meal.  . cholecalciferol (VITAMIN D) 1000 UNITS tablet Take 1,000 Units by mouth 2 (two) times daily.  . isosorbide mononitrate (IMDUR) 30 MG 24 hr tablet Take 30 mg by mouth daily.  Marland Kitchen lovastatin (MEVACOR) 10 MG tablet   . nitroGLYCERIN (NITROSTAT) 0.4 MG SL tablet Place 0.4 mg under the tongue every 5 (five) minutes as needed.  Marland Kitchen oxyCODONE-acetaminophen (PERCOCET/ROXICET) 5-325 MG per tablet Take 1 tablet by mouth every 4 (four) hours as needed.   . pantoprazole (PROTONIX) 40 MG tablet Take 40 mg by mouth daily.   No facility-administered encounter medications on file as of 06/22/2017.     Past Medical History:  Diagnosis Date  . Acute MI ([place])   . Allergy   . Anxiety   . Arthritis    all over  . Back pain   . COPD (chronic obstructive pulmonary disease) ([place])   . Coronary artery disease   . CTS (carpal tunnel syndrome) 01/06/2012  . GERD (gastroesophageal reflux disease)   . Hyperlipidemia   . Hypertension   . Stroke ([place])   . Syncope and collapse   .  TIA (transient ischemic attack)     Past Surgical History:  Procedure Laterality Date  . CARDIAC CATHETERIZATION     stent placement 2006 at [facility]   . PAROTIDECTOMY    . TUBAL LIGATION      Social History   Social History  . Marital status: Single    Spouse name: N/A  . Number of children: N/A  . Years of education: N/A   Occupational History  . Not on file.   Social History Main Topics  . Smoking status: Current Every Day Smoker    Packs/day: 0.25    Years: 10.00    Types: Cigarettes  . Smokeless tobacco: Never Used  .  Alcohol use No  . Drug use: No  . Sexual activity: Not Currently   Other Topics Concern  . Not on file   Social History Narrative  . No narrative on file    Family History  Problem Relation Age of Onset  . Adopted: Yes  . Arthritis Mother   . Hypertension Mother   . Arthritis Father   . Cancer Father        prostate  . Early death Son        MVA  . Heart disease Brother 1  . Aneurysm Sister     Review of Systems  Constitutional: Negative for chills, fever and weight loss.  HENT: Negative for congestion and hearing loss.   Eyes: Negative for blurred vision and pain.  Respiratory: Positive for cough. Negative for shortness of breath.        Recent URI  Cardiovascular: Negative for chest pain and leg swelling.  Gastrointestinal: Negative for abdominal pain, constipation, diarrhea and heartburn.  Genitourinary: Negative for dysuria and frequency.  Musculoskeletal: Positive for back pain. Negative for falls, joint pain and myalgias.       Chronic  Neurological: Negative for dizziness, seizures and headaches.  Psychiatric/Behavioral: Negative for depression. The patient is not nervous/anxious and does not have insomnia.     BP 136/86 (BP Location: Right Arm, Patient Position: Sitting, Cuff Size: Normal)   Pulse 64   Temp (!) 97.4 F (36.3 C) (Temporal)   Resp 18   Ht 5\' 7"  (1.702 m)   Wt 179 lb 0.6 oz (81.2 kg)   SpO2 100%   BMI 28.04 kg/m   Physical Exam  1. Tobacco abuse Counseled on quitting  2. Coronary artery disease involving native coronary artery of native heart without angina pectoris - Flu Vaccine QUAD 36+ mos IM - CBC - COMPLETE METABOLIC PANEL WITH GFR - Lipid panel - Urinalysis, Routine w reflex microscopic - VITAMIN D 25 Hydroxy (Vit-D Deficiency, Fractures) - Ambulatory referral to Cardiology  3. Hyperlipidemia, unspecified hyperlipidemia type  4. Essential hypertension  5. Mammogram declined  6. Colonoscopy refused   Patient  Instructions  You are overdue to see Dr Zella Richer in cardiology Make appointment with Dr Oleta Mouse in orthopedics to discuss pain manaqgement You are overdue for a mammogram You are overdue for a colonoscopy  Need labs today  Need records Dr Wenda Overland  See me in 3 months Call sooner for medication refills, questions, problems   Raylene Everts, MD

## 2017-06-22 NOTE — Patient Instructions (Addendum)
You are overdue to see Dr Zella Richer in cardiology Make appointment with Dr Oleta Mouse in orthopedics to discuss pain manaqgement You are overdue for a mammogram You are overdue for a colonoscopy  Need labs today  Need records Dr Wenda Overland  See me in 3 months Call sooner for medication refills, questions, problems

## 2017-06-22 NOTE — Telephone Encounter (Signed)
-----   Message from Raylene Everts, MD sent at 06/22/2017 12:29 PM EDT ----- See if lab can add hep c to her labs today

## 2017-06-22 NOTE — Telephone Encounter (Signed)
Called pt, states she wants a rx so she doesn't have to pay for it as she is out of $$ til the 1st of the month

## 2017-06-23 ENCOUNTER — Telehealth: Payer: Self-pay

## 2017-06-23 NOTE — Telephone Encounter (Signed)
Noted  

## 2017-06-23 NOTE — Telephone Encounter (Signed)
-----   Message from Raylene Everts, MD sent at 06/23/2017 12:59 PM EDT ----- I asked to add hep c to the labs and you said OK  ???

## 2017-06-23 NOTE — Telephone Encounter (Signed)
Patient informed of note below. She does not accept the answer and requests Dr. Meda Coffee to call her. I informed that Dr Meda Coffee is very busy, she states she does not care and hung up.

## 2017-06-23 NOTE — Telephone Encounter (Signed)
I have called [facility] again and they say it was added in yesterday at 1422

## 2017-06-26 LAB — URINALYSIS, ROUTINE W REFLEX MICROSCOPIC
Bilirubin Urine: NEGATIVE
Glucose, UA: NEGATIVE
Hgb urine dipstick: NEGATIVE
Ketones, ur: NEGATIVE
Leukocytes, UA: NEGATIVE
NITRITE: NEGATIVE
PROTEIN: NEGATIVE
SPECIFIC GRAVITY, URINE: 1.026 (ref 1.001–1.03)
pH: 5.5 (ref 5.0–8.0)

## 2017-06-26 LAB — COMPLETE METABOLIC PANEL WITH GFR
AG Ratio: 1.3 (calc) (ref 1.0–2.5)
ALKALINE PHOSPHATASE (APISO): 66 U/L (ref 33–130)
ALT: 11 U/L (ref 6–29)
AST: 17 U/L (ref 10–35)
Albumin: 3.9 g/dL (ref 3.6–5.1)
BUN: 14 mg/dL (ref 7–25)
CALCIUM: 9 mg/dL (ref 8.6–10.4)
CO2: 26 mmol/L (ref 20–32)
CREATININE: 0.91 mg/dL (ref 0.50–0.99)
Chloride: 108 mmol/L (ref 98–110)
GFR, EST NON AFRICAN AMERICAN: 64 mL/min/{1.73_m2} (ref >=60)
GFR, Est African American: 75 mL/min/{1.73_m2} (ref >=60)
GLUCOSE: 99 mg/dL (ref 65–139)
Globulin: 3 g/dL (calc) (ref 1.9–3.7)
Potassium: 4.1 mmol/L (ref 3.5–5.3)
Sodium: 139 mmol/L (ref 135–146)
Total Bilirubin: 0.6 mg/dL (ref 0.2–1.2)
Total Protein: 6.9 g/dL (ref 6.1–8.1)

## 2017-06-26 LAB — LIPID PANEL
CHOL/HDL RATIO: 3 (calc) (ref <5.0)
CHOLESTEROL: 137 mg/dL (ref <200)
HDL: 45 mg/dL — ABNORMAL LOW (ref >50)
LDL Cholesterol (Calc): 75 mg/dL (calc)
NON-HDL CHOLESTEROL (CALC): 92 mg/dL (ref <130)
TRIGLYCERIDES: 85 mg/dL (ref <150)

## 2017-06-26 LAB — CBC
HEMATOCRIT: 40 % (ref 35.0–45.0)
Hemoglobin: 13.2 g/dL (ref 11.7–15.5)
MCH: 27.5 pg (ref 27.0–33.0)
MCHC: 33 g/dL (ref 32.0–36.0)
MCV: 83.3 fL (ref 80.0–100.0)
MPV: 12.3 fL (ref 7.5–12.5)
PLATELETS: 160 10*3/uL (ref 140–400)
RBC: 4.8 10*6/uL (ref 3.80–5.10)
RDW: 12.8 % (ref 11.0–15.0)
WBC: 6.2 10*3/uL (ref 3.8–10.8)

## 2017-06-26 LAB — HEP C AB W/REFL
HEPATITIS C ANTIBODY REFILL: NONREACTIVE
SIGNAL TO CUT-OFF: 0.02 (ref <1.00)

## 2017-06-26 LAB — VITAMIN D 25 HYDROXY (VIT D DEFICIENCY, FRACTURES): VIT D 25 HYDROXY: 49 ng/mL (ref 30–100)

## 2017-06-26 LAB — REFLEX TIQ

## 2017-06-27 ENCOUNTER — Encounter: Payer: Self-pay | Admitting: Family Medicine

## 2017-07-06 ENCOUNTER — Telehealth: Payer: Self-pay | Admitting: Family Medicine

## 2017-07-06 NOTE — Telephone Encounter (Signed)
Left message to call office

## 2017-07-06 NOTE — Telephone Encounter (Signed)
Patient called and stated:  Her medication was not called in on her last visit, she doesn't know the name of medications, she states we should have it because we prescribed it." [facility]. Cb#: [phone]

## 2017-07-07 MED ORDER — ISOSORBIDE MONONITRATE ER 30 MG PO TB24: 30.0000 mg | ORAL_TABLET | Freq: Every day | ORAL | 3 refills | Status: DC

## 2017-07-07 MED ORDER — LOVASTATIN 10 MG PO TABS: 10.0000 mg | ORAL_TABLET | Freq: Every day | ORAL | 3 refills | Status: DC

## 2017-07-07 MED ORDER — PANTOPRAZOLE SODIUM 40 MG PO TBEC: 40.0000 mg | DELAYED_RELEASE_TABLET | Freq: Every day | ORAL | 3 refills | Status: DC

## 2017-07-07 MED ORDER — CARVEDILOL 25 MG PO TABS: 25.0000 mg | ORAL_TABLET | Freq: Two times a day (BID) | ORAL | 3 refills | Status: DC

## 2017-07-07 NOTE — Telephone Encounter (Signed)
Seen 06/2017

## 2017-08-08 ENCOUNTER — Other Ambulatory Visit: Payer: Self-pay

## 2017-08-16 ENCOUNTER — Other Ambulatory Visit: Payer: Self-pay

## 2017-08-18 ENCOUNTER — Other Ambulatory Visit: Payer: Self-pay

## 2017-08-18 MED ORDER — IBUPROFEN 800 MG PO TABS: 800.0000 mg | ORAL_TABLET | Freq: Three times a day (TID) | ORAL | 0 refills | Status: DC | PRN

## 2017-08-20 NOTE — Progress Notes (Signed)
Cardiology Office Note  Date:  08/22/2017   ID:  Tiffane, Sheldon [birth year], MRN [id number]  PCP:  Raylene Everts, MD   Chief Complaint  Patient presents with  . other    OD appointment c/o leg pain . Meds reviewed verbally with pt.    HPI:  Mrs. Reali is a pleasant 71 year old woman with a history of  coronary artery disease, previous stent x2 in 2009 to her LAD,  Hypertension, Hyperlipidemia,  tobacco abuse,  TIA   presented to the hospital 04/16/2013 with chest pain. Initial troponin 0.6 with MB greater than 5. Cardiac catheterization showed severe ostial D2 and D3 disease. The D2 ostium was at the distal end of the LAD stent. LAD stent was patent. D3 was a small vessel. Ejection fraction 55%. No intervention was performed.  She presents today for follow-up of her coronary artery disease  Out of pain meds, PMD passed away, changed doctor, will not write scripts Needs pain clinic Previous corticone shots in back, does not like needles,  Still smoking Less than 1 pack/day  Denies any chest pain, shortness of breath concerning for angina Trying to lose weight Helping to take care of her elderly mother Primary care recently renewed her medications No regular exercise program  Lab work reviewed  total cholesterol 115, LDL 48  EKG on today's visit shows normal sinus rhythm APCs noted with left bundle branch block, rate 65 bpm  Other past medical history Prior significant stress at home in the past, as her daughter is sick, has a tracheostomy tube, previous stroke.    PMH:   has a past medical history of Acute MI ([place]), Allergy, Anxiety, Arthritis, Back pain, COPD (chronic obstructive pulmonary disease) ([place]), Coronary artery disease, CTS (carpal tunnel syndrome) (01/06/2012), GERD (gastroesophageal reflux disease), Hyperlipidemia, Hypertension, Stroke ([place]), Syncope and collapse, and TIA (transient ischemic attack).  PSH:    Past Surgical History:   Procedure Laterality Date  . CARDIAC CATHETERIZATION     stent placement 2006 at [facility]   . PAROTIDECTOMY    . TUBAL LIGATION      Current Outpatient Medications  Medication Sig Dispense Refill  . aspirin 81 MG tablet Take 81 mg by mouth daily.    . carvedilol (COREG) 25 MG tablet Take 1 tablet (25 mg total) by mouth 2 (two) times daily with a meal. 180 tablet 3  . cholecalciferol (VITAMIN D) 1000 UNITS tablet Take 1,000 Units by mouth 2 (two) times daily.    Marland Kitchen ibuprofen (IBU) 800 MG tablet Take 1 tablet (800 mg total) every 8 (eight) hours as needed by mouth. 30 tablet 0  . isosorbide mononitrate (IMDUR) 30 MG 24 hr tablet Take 1 tablet (30 mg total) by mouth daily. 90 tablet 3  . lovastatin (MEVACOR) 10 MG tablet Take 1 tablet (10 mg total) by mouth at bedtime. 90 tablet 3  . nitroGLYCERIN (NITROSTAT) 0.4 MG SL tablet Place 0.4 mg under the tongue every 5 (five) minutes as needed.    Marland Kitchen oxyCODONE-acetaminophen (PERCOCET/ROXICET) 5-325 MG per tablet Take 1 tablet by mouth every 4 (four) hours as needed.     . pantoprazole (PROTONIX) 40 MG tablet Take 1 tablet (40 mg total) by mouth daily. 90 tablet 3   No current facility-administered medications for this visit.      Allergies:   Amlodipine; Atorvastatin; and Clopidogrel bisulfate   Social History:  The patient  reports that she has been smoking cigarettes.  She has a  2.50 pack-year smoking history. she has never used smokeless tobacco. She reports that she does not drink alcohol or use drugs.   Family History:   family history includes Aneurysm in her sister; Arthritis in her father and mother; Cancer in her father; Early death in her son; Heart disease (age of onset: 19) in her brother; Hypertension in her mother. She was adopted.    Review of Systems: Review of Systems  Constitutional: Negative.   Respiratory: Negative.   Cardiovascular: Negative.   Gastrointestinal: Negative.   Musculoskeletal: Negative.    Neurological: Negative.   Psychiatric/Behavioral: Negative.   All other systems reviewed and are negative.    PHYSICAL EXAM: VS:  BP (!) 142/88 (BP Location: Left Arm, Patient Position: Sitting, Cuff Size: Normal)   Pulse 63   Ht 5\' 7"  (1.702 m)   Wt 183 lb (83 kg)   BMI 28.66 kg/m  , BMI Body mass index is 28.66 kg/m. GEN: Well nourished, well developed, in no acute distress  HEENT: normal  Neck: no JVD, carotid bruits, or masses Cardiac: RRR; no murmurs, rubs, or gallops,no edema  Respiratory:  clear to auscultation bilaterally, normal work of breathing GI: soft, nontender, nondistended, + BS MS: no deformity or atrophy  Skin: warm and dry, no rash Neuro:  Strength and sensation are intact Psych: euthymic mood, full affect    Recent Labs: 06/22/2017: ALT 11; BUN 14; Creat 0.91; Hemoglobin 13.2; Platelets 160; Potassium 4.1; Sodium 139    Lipid Panel Lab Results  Component Value Date   CHOL 137 06/22/2017   HDL 45 (L) 06/22/2017   TRIG 85 06/22/2017      Wt Readings from Last 3 Encounters:  08/22/17 183 lb (83 kg)  06/22/17 179 lb 0.6 oz (81.2 kg)  08/05/15 187 lb 12 oz (85.2 kg)       ASSESSMENT AND PLAN:  Angina pectoris ([place]) - Plan: EKG 12-Lead Currently with no symptoms of angina. No further workup at this time. Continue current medication regimen.  Coronary artery disease of native artery of native heart with stable angina pectoris ([place]) - Plan: EKG 12-Lead Stable, no further testing at this time  Mixed hyperlipidemia Cholesterol is at goal on the current lipid regimen. No changes to the medications were made.  Tobacco abuse We have encouraged her to continue to work on weaning her cigarettes and smoking cessation. She will continue to work on this and does not want any assistance with chantix.   Chronic pain Previously seen by neurosurgery, cortisone shots to the back No history of back in radiating pain down her legs Needs to reestablish with  pain clinic Will place referral locally   Disposition:   F/U  12 months   Total encounter time more than 25 minutes  Greater than 50% was spent in counseling and coordination of care with the patient    Orders Placed This Encounter  Procedures  . EKG 12-Lead     Signed, Esmond Plants, M.D., Ph.D. 08/22/2017  [place], [place]

## 2017-08-22 ENCOUNTER — Encounter: Payer: Self-pay | Admitting: Cardiovascular Disease

## 2017-08-22 ENCOUNTER — Ambulatory Visit (INDEPENDENT_AMBULATORY_CARE_PROVIDER_SITE_OTHER): Payer: Medicare HMO | Admitting: Cardiovascular Disease

## 2017-08-22 VITALS — BP 142/88 | HR 63 | Ht 67.0 in | Wt 183.0 lb

## 2017-08-22 DIAGNOSIS — I209 Angina pectoris, unspecified: Secondary | ICD-10-CM

## 2017-08-22 DIAGNOSIS — Z720 Tobacco use: Secondary | ICD-10-CM

## 2017-08-22 DIAGNOSIS — Z87891 Personal history of nicotine dependence: Secondary | ICD-10-CM | POA: Diagnosis not present

## 2017-08-22 DIAGNOSIS — I25118 Atherosclerotic heart disease of native coronary artery with other forms of angina pectoris: Secondary | ICD-10-CM

## 2017-08-22 DIAGNOSIS — M549 Dorsalgia, unspecified: Secondary | ICD-10-CM

## 2017-08-22 DIAGNOSIS — G8929 Other chronic pain: Secondary | ICD-10-CM

## 2017-08-22 DIAGNOSIS — E782 Mixed hyperlipidemia: Secondary | ICD-10-CM | POA: Diagnosis not present

## 2017-08-22 NOTE — Patient Instructions (Signed)

## 2017-09-07 ENCOUNTER — Telehealth: Payer: Self-pay | Admitting: Family Medicine

## 2017-09-07 NOTE — Telephone Encounter (Signed)
Called patient to reschedule appt for 09/21/17 (per r/s list) Patient stated she wanted to cancel because she "switched doctors, I need a pain DR." so I then asked " are you dismissing yourself" and patient said yes.  Please advise.

## 2017-09-21 ENCOUNTER — Ambulatory Visit: Payer: Medicare HMO | Admitting: Family Medicine

## 2017-09-29 ENCOUNTER — Ambulatory Visit: Payer: Medicare HMO | Admitting: Family Medicine

## 2017-10-09 ENCOUNTER — Ambulatory Visit
Payer: Medicare HMO | Attending: Student in an Organized Health Care Education/Training Program | Admitting: Student in an Organized Health Care Education/Training Program

## 2017-10-09 ENCOUNTER — Encounter: Payer: Self-pay | Admitting: Student in an Organized Health Care Education/Training Program

## 2017-10-09 ENCOUNTER — Encounter (INDEPENDENT_AMBULATORY_CARE_PROVIDER_SITE_OTHER): Payer: Self-pay

## 2017-10-09 ENCOUNTER — Other Ambulatory Visit: Payer: Self-pay

## 2017-10-09 VITALS — BP 181/96 | HR 56 | Temp 97.8°F | Resp 18 | Ht 67.0 in | Wt 177.0 lb

## 2017-10-09 DIAGNOSIS — G5600 Carpal tunnel syndrome, unspecified upper limb: Secondary | ICD-10-CM | POA: Diagnosis not present

## 2017-10-09 DIAGNOSIS — Z79899 Other long term (current) drug therapy: Secondary | ICD-10-CM | POA: Diagnosis not present

## 2017-10-09 DIAGNOSIS — M47816 Spondylosis without myelopathy or radiculopathy, lumbar region: Secondary | ICD-10-CM

## 2017-10-09 DIAGNOSIS — I252 Old myocardial infarction: Secondary | ICD-10-CM | POA: Diagnosis not present

## 2017-10-09 DIAGNOSIS — E785 Hyperlipidemia, unspecified: Secondary | ICD-10-CM | POA: Insufficient documentation

## 2017-10-09 DIAGNOSIS — M4726 Other spondylosis with radiculopathy, lumbar region: Secondary | ICD-10-CM | POA: Diagnosis not present

## 2017-10-09 DIAGNOSIS — J449 Chronic obstructive pulmonary disease, unspecified: Secondary | ICD-10-CM | POA: Diagnosis not present

## 2017-10-09 DIAGNOSIS — M5416 Radiculopathy, lumbar region: Secondary | ICD-10-CM

## 2017-10-09 DIAGNOSIS — M5116 Intervertebral disc disorders with radiculopathy, lumbar region: Secondary | ICD-10-CM | POA: Insufficient documentation

## 2017-10-09 DIAGNOSIS — F419 Anxiety disorder, unspecified: Secondary | ICD-10-CM | POA: Diagnosis not present

## 2017-10-09 DIAGNOSIS — Z8673 Personal history of transient ischemic attack (TIA), and cerebral infarction without residual deficits: Secondary | ICD-10-CM | POA: Diagnosis not present

## 2017-10-09 DIAGNOSIS — M5136 Other intervertebral disc degeneration, lumbar region: Secondary | ICD-10-CM | POA: Diagnosis not present

## 2017-10-09 DIAGNOSIS — G894 Chronic pain syndrome: Secondary | ICD-10-CM | POA: Diagnosis not present

## 2017-10-09 DIAGNOSIS — M4807 Spinal stenosis, lumbosacral region: Secondary | ICD-10-CM | POA: Diagnosis not present

## 2017-10-09 DIAGNOSIS — Z7982 Long term (current) use of aspirin: Secondary | ICD-10-CM | POA: Insufficient documentation

## 2017-10-09 DIAGNOSIS — I25119 Atherosclerotic heart disease of native coronary artery with unspecified angina pectoris: Secondary | ICD-10-CM | POA: Insufficient documentation

## 2017-10-09 DIAGNOSIS — M545 Low back pain: Secondary | ICD-10-CM | POA: Insufficient documentation

## 2017-10-09 DIAGNOSIS — I10 Essential (primary) hypertension: Secondary | ICD-10-CM | POA: Insufficient documentation

## 2017-10-09 DIAGNOSIS — F17210 Nicotine dependence, cigarettes, uncomplicated: Secondary | ICD-10-CM | POA: Diagnosis not present

## 2017-10-09 DIAGNOSIS — K219 Gastro-esophageal reflux disease without esophagitis: Secondary | ICD-10-CM | POA: Insufficient documentation

## 2017-10-09 MED ORDER — GABAPENTIN 300 MG PO CAPS: ORAL_CAPSULE | ORAL | 2 refills | Status: DC

## 2017-10-09 MED ORDER — BACLOFEN 10 MG PO TABS: 10.0000 mg | ORAL_TABLET | Freq: Two times a day (BID) | ORAL | 0 refills | Status: DC | PRN

## 2017-10-09 NOTE — Progress Notes (Signed)
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 understanding.  Patient previously has had lumbar epidural steroid injections which were helpful for her lumbar radicular symptoms.  Her last one was approximately 3 years ago.  She is open to having this repeated but wants to hold off at the time.  Plan: -Lumbar radiculopathy: Discussion of lumbar epidural steroid injection at L4-5 or  L5-S1.  Patient has received benefit from these in the past and can consider repeating.  Patient wants to hold off at this time. -Patient actively using marijuana for pain management.  Instructed her to stop to be considered for opioid therapy if she wishes.  I have instructed to her that should she sign an opioid contract with our clinic, any future urine drug screens positive for THC will be grounds to have her pain contract voided with [place].  Patient endorsed understanding.  Patient will need UDS at next visit. -Prescription for gabapentin as below. -Prescription for baclofen  for muscle spasms as needed as below. -Follow-up in 4-6 weeks.  Consider repeat lumbar MRI without contrast if symptoms worsen.  Note: Please be advised that as per protocol, today's visit has been an evaluation only. We have not taken over the patient's controlled substance management.  Pharmacotherapy (current): Medications ordered:  Meds ordered this encounter  Medications  . gabapentin (NEURONTIN) 300 MG capsule    Sig: 300 mg qhs x 2 weeks then 300 mg BID    Dispense:  60 capsule    Refill:  2  . baclofen (LIORESAL) 10 MG tablet    Sig: Take 1 tablet (10 mg total) by mouth 2 (two) times daily as needed for muscle spasms.    Dispense:  60 tablet    Refill:  0    Do not place this medication, or any other prescription from our practice, on "Automatic Refill". Patient may have prescription filled one day early if pharmacy is closed on scheduled refill date.   Medications administered during this visit: Myeesha F. Allcock had no medications administered during this visit.   Pharmacological management options:  Opioid Analgesics: The patient was informed that there is no guarantee that she would be a candidate for opioid analgesics. The decision will be made following CDC guidelines. This decision will be based on the results of diagnostic studies, as well as Ms. Qualls's risk profile.   Membrane stabilizer: To be determined at a later time  Muscle relaxant: To be determined at a later time  NSAID: To be determined at a later time  Other analgesic(s): To be determined at a later time   Interventional management options: Ms. Essex was informed that there is no guarantee that she would be a candidate for interventional therapies. The decision will be based on the results of diagnostic studies, as well as Ms. Angelillo's risk profile.  Procedure(s) under consideration:  -lumbar ESI -lumbar facets bilaterally -SI J   Provider-requested follow-up: Return in about 5 weeks (around  11/13/2017) for Medication Management.  Future Appointments  Date Time Provider [place]  11/14/2017 11:30 AM Gillis Santa, MD [facility] None    Primary Care Physician: Rosita Fire, MD Location: [facility] Facility Note by: Gillis Santa, M.D, Date: 10/09/2017; Time: 3:40 PM  Patient Instructions

## 2017-10-09 NOTE — Progress Notes (Signed)
Safety precautions to be maintained throughout the outpatient stay will include: orient to surroundings, keep bed in low position, maintain call bell within reach at all times, provide assistance with transfer out of bed and ambulation.  

## 2017-11-14 ENCOUNTER — Other Ambulatory Visit: Payer: Self-pay

## 2017-11-14 ENCOUNTER — Encounter: Payer: Self-pay | Admitting: Student in an Organized Health Care Education/Training Program

## 2017-11-14 ENCOUNTER — Ambulatory Visit
Payer: Medicare HMO | Attending: Student in an Organized Health Care Education/Training Program | Admitting: Student in an Organized Health Care Education/Training Program

## 2017-11-14 VITALS — BP 139/80 | HR 70 | Temp 98.0°F | Resp 16 | Ht 67.0 in | Wt 170.0 lb

## 2017-11-14 DIAGNOSIS — M5136 Other intervertebral disc degeneration, lumbar region: Secondary | ICD-10-CM

## 2017-11-14 DIAGNOSIS — F419 Anxiety disorder, unspecified: Secondary | ICD-10-CM | POA: Insufficient documentation

## 2017-11-14 DIAGNOSIS — Z8673 Personal history of transient ischemic attack (TIA), and cerebral infarction without residual deficits: Secondary | ICD-10-CM | POA: Insufficient documentation

## 2017-11-14 DIAGNOSIS — M47816 Spondylosis without myelopathy or radiculopathy, lumbar region: Secondary | ICD-10-CM | POA: Diagnosis not present

## 2017-11-14 DIAGNOSIS — I10 Essential (primary) hypertension: Secondary | ICD-10-CM | POA: Diagnosis not present

## 2017-11-14 DIAGNOSIS — I252 Old myocardial infarction: Secondary | ICD-10-CM | POA: Insufficient documentation

## 2017-11-14 DIAGNOSIS — M5416 Radiculopathy, lumbar region: Secondary | ICD-10-CM

## 2017-11-14 DIAGNOSIS — Z888 Allergy status to other drugs, medicaments and biological substances status: Secondary | ICD-10-CM | POA: Insufficient documentation

## 2017-11-14 DIAGNOSIS — J449 Chronic obstructive pulmonary disease, unspecified: Secondary | ICD-10-CM | POA: Insufficient documentation

## 2017-11-14 DIAGNOSIS — Z79899 Other long term (current) drug therapy: Secondary | ICD-10-CM | POA: Insufficient documentation

## 2017-11-14 DIAGNOSIS — Z7982 Long term (current) use of aspirin: Secondary | ICD-10-CM | POA: Diagnosis not present

## 2017-11-14 DIAGNOSIS — G894 Chronic pain syndrome: Secondary | ICD-10-CM | POA: Insufficient documentation

## 2017-11-14 DIAGNOSIS — M5116 Intervertebral disc disorders with radiculopathy, lumbar region: Secondary | ICD-10-CM | POA: Diagnosis not present

## 2017-11-14 DIAGNOSIS — G5603 Carpal tunnel syndrome, bilateral upper limbs: Secondary | ICD-10-CM | POA: Insufficient documentation

## 2017-11-14 DIAGNOSIS — F17210 Nicotine dependence, cigarettes, uncomplicated: Secondary | ICD-10-CM | POA: Diagnosis not present

## 2017-11-14 DIAGNOSIS — Z5181 Encounter for therapeutic drug level monitoring: Secondary | ICD-10-CM | POA: Insufficient documentation

## 2017-11-14 DIAGNOSIS — K219 Gastro-esophageal reflux disease without esophagitis: Secondary | ICD-10-CM | POA: Insufficient documentation

## 2017-11-14 DIAGNOSIS — I25119 Atherosclerotic heart disease of native coronary artery with unspecified angina pectoris: Secondary | ICD-10-CM | POA: Insufficient documentation

## 2017-11-14 DIAGNOSIS — E785 Hyperlipidemia, unspecified: Secondary | ICD-10-CM | POA: Insufficient documentation

## 2017-11-14 MED ORDER — MELOXICAM 7.5 MG PO TABS: 7.5000 mg | ORAL_TABLET | Freq: Every day | ORAL | 1 refills | Status: DC

## 2017-11-14 NOTE — Progress Notes (Signed)
Safety precautions to be maintained throughout the outpatient stay will include: orient to surroundings, keep bed in low position, maintain call bell within reach at all times, provide assistance with transfer out of bed and ambulation.  

## 2017-11-14 NOTE — Progress Notes (Signed)
Patient's Name: Sophia Briggs  MRN: [id number]  Referring Provider: Rosita Fire, MD  DOB: 1946/03/29  PCP: Rosita Fire, MD  DOS: 11/14/2017  Note by: Gillis Santa, MD  Service setting: Ambulatory outpatient  Specialty: Interventional Pain Management  Location: [facility] (AMB) Pain Management Facility    Patient type: Established   Primary Reason(s) for Visit: Encounter for prescription drug management. (Level of risk: moderate)  CC: Back Pain (lower) and Leg Pain (bilaterally)  HPI  Sophia Briggs is a 72 y.o. year old, female patient, who comes today for a medication management evaluation. She has HLD (hyperlipidemia); THROMBOCYTOPENIA; TRANSIENT ISCHEMIC ATTACK; EPISTAXIS; CAD (coronary artery disease); Chest pain; Hyperlipidemia; Smoking history; Angina pectoris ([place]); CTS (carpal tunnel syndrome); Lumbar radiculopathy; Stenosis of lumbosacral spine; and Tobacco abuse on their problem list. Her primarily concern today is the Back Pain (lower) and Leg Pain (bilaterally)  Pain Assessment: Location: Lower Back Radiating: moves through hips, down the backs of both legs to calf area Onset:  >3 months ago Duration: Chronic pain Quality: Constant, Sharp Severity: 6 /10 (self-reported pain score)  Note: Reported level is compatible with observation.                         When using our objective Pain Scale, levels between 6 and 10/10 are said to belong in an emergency room, as it progressively worsens from a 6/10, described as severely limiting, requiring emergency care not usually available at an outpatient pain management facility. At a 6/10 level, communication becomes difficult and requires great effort. Assistance to reach the emergency department may be required. Facial flushing and profuse sweating along with potentially dangerous increases in heart rate and blood pressure will be evident. Effect on ADL: pain has slowed pt down, "stairs are no longer an option" Timing:  Constant Modifying factors: medications, procedure, hot soaks  Sophia Briggs was last scheduled for an appointment on 10/09/2017 for medication management. During today's appointment we reviewed Sophia Briggs's chronic pain status, as well as her outpatient medication regimen.  Patient returns for follow-up.  She states that the gabapentin is making her too sedated and that she does not feel like herself.  She has been taking 300 mg twice a day.  She is also utilizing baclofen 10 mg twice daily to 3 times daily as needed for muscle spasms.  Patient was previously using marijuana for pain control.  She states that she has stopped this.  We will obtain a repeat urine drug screen to confirm this.  No opioid medications at this time.  Patient is endorsing worsening low back and leg pain.  She is finding it difficult to stand erect and walk.  Patient's last MRI was over 10 years ago and is wondering if we can repeat lumbar MRI.  The patient  reports that she uses drugs. Drug: Marijuana. Her body mass index is 26.63 kg/m.  Further details on both, my assessment(s), as well as the proposed treatment plan, please see below.  Controlled Substance Pharmacotherapy Assessment REMS (Risk Evaluation and Mitigation Strategy)  Analgesic: N/A MME/day: N/A Sophia Briggs  11/14/2017 11:45 AM  Sign at close encounter Safety precautions to be maintained throughout the outpatient stay will include: orient to surroundings, keep bed in low position, maintain call bell within reach at all times, provide assistance with transfer out of bed and ambulation.     Monitoring: [place] PMP: Online review of the past 76-monthperiod conducted. Compliant with practice rules and regulations  Laboratory Chemistry  Inflammation Markers (CRP: Acute Phase) (ESR: Chronic Phase) No results found for: CRP, ESRSEDRATE, LATICACIDVEN               Rheumatology Markers No results found for: RF, ANA, Therisa Doyne,  [facility]              Renal Function Markers Lab Results  Component Value Date   BUN 14 06/22/2017   CREATININE 0.91 06/22/2017   GFRAA 75 06/22/2017   GFRNONAA 64 06/22/2017                 Hepatic Function Markers Lab Results  Component Value Date   AST 17 06/22/2017   ALT 11 06/22/2017   ALBUMIN 3.4 04/16/2013   ALKPHOS 91 04/16/2013   LIPASE 131 04/16/2013                 Electrolytes Lab Results  Component Value Date   NA 139 06/22/2017   K 4.1 06/22/2017   CL 108 06/22/2017   CALCIUM 9.0 06/22/2017   MG 1.8 04/16/2013                 Neuropathy Markers No results found for: VITAMINB12, FOLATE, HGBA1C, HIV               Bone Pathology Markers Lab Results  Component Value Date   VD25OH 49 06/22/2017                 Coagulation Parameters Lab Results  Component Value Date   INR 1.0 04/16/2013   LABPROT 13.3 04/16/2013   PLT 160 06/22/2017                 Cardiovascular Markers Lab Results  Component Value Date   CKTOTAL 154 04/16/2013   CKMB 6.4 (H) 04/16/2013   TROPONINI 1.20 (H) 04/16/2013   HGB 13.2 06/22/2017   HCT 40.0 06/22/2017                 CA Markers No results found for: CEA, CA125, LABCA2               Note: Lab results reviewed.   Meds   Current Outpatient Medications:  .  aspirin 81 MG tablet, Take 81 mg by mouth daily., Disp: , Rfl:  .  baclofen (LIORESAL) 10 MG tablet, Take 1 tablet (10 mg total) by mouth 2 (two) times daily as needed for muscle spasms., Disp: 60 tablet, Rfl: 0 .  carvedilol (COREG) 25 MG tablet, Take 1 tablet (25 mg total) by mouth 2 (two) times daily with a meal., Disp: 180 tablet, Rfl: 3 .  cholecalciferol (VITAMIN D) 1000 UNITS tablet, Take 1,000 Units by mouth 2 (two) times daily., Disp: , Rfl:  .  gabapentin (NEURONTIN) 300 MG capsule, 300 mg qhs x 2 weeks then 300 mg BID, Disp: 60 capsule, Rfl: 2 .  ibuprofen (IBU) 800 MG tablet, Take 1 tablet (800 mg total) every 8 (eight) hours as needed by  mouth., Disp: 30 tablet, Rfl: 0 .  isosorbide mononitrate (IMDUR) 30 MG 24 hr tablet, Take 1 tablet (30 mg total) by mouth daily., Disp: 90 tablet, Rfl: 3 .  lovastatin (MEVACOR) 10 MG tablet, Take 1 tablet (10 mg total) by mouth at bedtime., Disp: 90 tablet, Rfl: 3 .  nitroGLYCERIN (NITROSTAT) 0.4 MG SL tablet, Place 0.4 mg under the tongue every 5 (five) minutes as needed., Disp: , Rfl:  .  pantoprazole (PROTONIX) 40 MG tablet,  Take 1 tablet (40 mg total) by mouth daily., Disp: 90 tablet, Rfl: 3 .  meloxicam (MOBIC) 7.5 MG tablet, Take 1 tablet (7.5 mg total) by mouth daily., Disp: 30 tablet, Rfl: 1 .  oxyCODONE-acetaminophen (PERCOCET/ROXICET) 5-325 MG per tablet, Take 1 tablet by mouth every 4 (four) hours as needed. , Disp: , Rfl:   ROS  Constitutional: Denies any fever or chills Gastrointestinal: No reported hemesis, hematochezia, vomiting, or acute GI distress Musculoskeletal: Denies any acute onset joint swelling, redness, loss of ROM, or weakness Neurological: No reported episodes of acute onset apraxia, aphasia, dysarthria, agnosia, amnesia, paralysis, loss of coordination, or loss of consciousness  Allergies  Ms. Kyer is allergic to amlodipine; atorvastatin; and clopidogrel bisulfate.  PFSH  Drug: Ms. Wilbanks  reports that she uses drugs. Drug: Marijuana. Alcohol:  reports that she does not drink alcohol. Tobacco:  reports that she has been smoking cigarettes.  She has smoked for the past 10.00 years. she has never used smokeless tobacco. Medical:  has a past medical history of Acute MI ([place]), Allergy, Anxiety, Arthritis, Back pain, COPD (chronic obstructive pulmonary disease) ([place]), Coronary artery disease, CTS (carpal tunnel syndrome) (01/06/2012), GERD (gastroesophageal reflux disease), Hyperlipidemia, Hypertension, Stroke ([place]), Syncope and collapse, and TIA (transient ischemic attack). Surgical: Ms. Delisle  has a past surgical history that includes Cardiac catheterization;  Tubal ligation; and Parotidectomy. Family: family history includes Aneurysm in her sister; Arthritis in her father and mother; Cancer in her father; Early death in her son; Heart disease (age of onset: 47) in her brother; Hypertension in her mother. She was adopted.  Constitutional Exam  General appearance: Well nourished, well developed, and well hydrated. In no apparent acute distress Vitals:   11/14/17 1129  BP: 139/80  Pulse: 70  Resp: 16  Temp: 98 F (36.7 C)  TempSrc: Oral  SpO2: 100%  Weight: 170 lb (77.1 kg)  Height: '5\' 7"'$  (1.702 m)   BMI Assessment: Estimated body mass index is 26.63 kg/m as calculated from the following:   Height as of this encounter: '5\' 7"'$  (1.702 m).   Weight as of this encounter: 170 lb (77.1 kg).  BMI interpretation table: BMI level Category Range association with higher incidence of chronic pain  <18 kg/m2 Underweight   18.5-24.9 kg/m2 Ideal body weight   25-29.9 kg/m2 Overweight Increased incidence by 20%  30-34.9 kg/m2 Obese (Class I) Increased incidence by 68%  35-39.9 kg/m2 Severe obesity (Class II) Increased incidence by 136%  >40 kg/m2 Extreme obesity (Class III) Increased incidence by 254%   BMI Readings from Last 4 Encounters:  11/14/17 26.63 kg/m  10/09/17 27.72 kg/m  08/22/17 28.66 kg/m  06/22/17 28.04 kg/m   Wt Readings from Last 4 Encounters:  11/14/17 170 lb (77.1 kg)  10/09/17 177 lb (80.3 kg)  08/22/17 183 lb (83 kg)  06/22/17 179 lb 0.6 oz (81.2 kg)  Psych/Mental status: Alert, oriented x 3 (person, place, & time)       Eyes: PERLA Respiratory: No evidence of acute respiratory distress  Cervical Spine Area Exam  Skin & Axial Inspection: No masses, redness, edema, swelling, or associated skin lesions Alignment: Symmetrical Functional ROM: Unrestricted ROM      Stability: No instability detected Muscle Tone/Strength: Functionally intact. No obvious neuro-muscular anomalies detected. Sensory (Neurological):  Unimpaired Palpation: No palpable anomalies              Upper Extremity (UE) Exam    Side: Right upper extremity  Side: Left upper extremity  Skin & Extremity Inspection: Skin color, temperature, and hair growth are WNL. No peripheral edema or cyanosis. No masses, redness, swelling, asymmetry, or associated skin lesions. No contractures.  Skin & Extremity Inspection: Skin color, temperature, and hair growth are WNL. No peripheral edema or cyanosis. No masses, redness, swelling, asymmetry, or associated skin lesions. No contractures.  Functional ROM: Unrestricted ROM          Functional ROM: Unrestricted ROM          Muscle Tone/Strength: Functionally intact. No obvious neuro-muscular anomalies detected.  Muscle Tone/Strength: Functionally intact. No obvious neuro-muscular anomalies detected.  Sensory (Neurological): Unimpaired          Sensory (Neurological): Unimpaired          Palpation: No palpable anomalies              Palpation: No palpable anomalies              Specialized Test(s): Deferred         Specialized Test(s): Deferred          Thoracic Spine Area Exam  Skin & Axial Inspection: No masses, redness, or swelling Alignment: Symmetrical Functional ROM: Unrestricted ROM Stability: No instability detected Muscle Tone/Strength: Functionally intact. No obvious neuro-muscular anomalies detected. Sensory (Neurological): Unimpaired Muscle strength & Tone: No palpable anomalies  Lumbar Spine Area Exam  Skin & Axial Inspection: No masses, redness, or swelling Alignment: Symmetrical Functional ROM: Unrestricted ROM      Stability: No instability detected Muscle Tone/Strength: Functionally intact. No obvious neuro-muscular anomalies detected. Sensory (Neurological): Unimpaired Palpation: No palpable anomalies       Provocative Tests: Lumbar Hyperextension and rotation test: Positive bilaterally for facet joint pain. Lumbar Lateral bending test: Positive due to pain. Patrick's  Maneuver: Positive for bilateral S-I arthralgia              Gait & Posture Assessment  Ambulation: Patient ambulates using a cane Gait: Age-related, senile gait pattern Posture: Difficulty standing up straight, due to pain   Lower Extremity Exam    Side: Right lower extremity  Side: Left lower extremity  Skin & Extremity Inspection: Skin color, temperature, and hair growth are WNL. No peripheral edema or cyanosis. No masses, redness, swelling, asymmetry, or associated skin lesions. No contractures.  Skin & Extremity Inspection: Skin color, temperature, and hair growth are WNL. No peripheral edema or cyanosis. No masses, redness, swelling, asymmetry, or associated skin lesions. No contractures.  Functional ROM: Unrestricted ROM          Functional ROM: Unrestricted ROM          Muscle Tone/Strength: Functionally intact. No obvious neuro-muscular anomalies detected.  Muscle Tone/Strength: Functionally intact. No obvious neuro-muscular anomalies detected.  Sensory (Neurological): Unimpaired  Sensory (Neurological): Unimpaired  Palpation: No palpable anomalies  Palpation: No palpable anomalies   Assessment  Primary Diagnosis & Pertinent Problem List: The primary encounter diagnosis was Lumbar radiculopathy. Diagnoses of Lumbar spondylosis, Lumbar degenerative disc disease, and Chronic pain syndrome were also pertinent to this visit.  Status Diagnosis  Persistent Persistent Persistent 1. Lumbar radiculopathy   2. Lumbar spondylosis   3. Lumbar degenerative disc disease   4. Chronic pain syndrome      General Recommendations: The pain condition that the patient suffers from is best treated with a multidisciplinary approach that involves an increase in physical activity to prevent de-conditioning and worsening of the pain cycle, as well as psychological counseling (formal and/or informal) to address the co-morbid psychological affects  of pain. Treatment will often involve judicious use of  pain medications and interventional procedures to decrease the pain, allowing the patient to participate in the physical activity that will ultimately produce long-lasting pain reductions. The goal of the multidisciplinary approach is to return the patient to a higher level of overall function and to restore their ability to perform activities of daily living.  72 year old female with an history of axial low back pain that radiates into bilateral lower extremities to her calves secondary to lumbar degenerative disc disease, lumbar radiculopathy, lumbar spondylosis most pronounced at L4/5, L5/S1 as evidenced on patient's MRI from 2009.  The patient also has pain with facet loading and lateral rotation.  We discussed repeating lumbar MRI to evaluate for interval disease progression given worsening symptoms of axial low back pain and occasional weakness of her lower extremities.  Patient did have full strength on exam today, 5 out of 5 for lower extremities.  At the last visit, gabapentin was started at 300 mg and patient was instructed to increase to 300 mill grams twice daily.  Patient states that this resulted in sedation and cognitive dysfunction.  She does find the medication helpful for her pain symptoms.  I will have her reduce her dose to 300 mg nightly.  Patient does find some benefit with baclofen for muscle spasms.  She can continue 10 mill grams twice daily to 3 times daily as needed.  Patient is requesting opioid therapy.  She states that she has stopped her intake of marijuana.  I will obtain repeat urine drug screen today to confirm this.  Her prior opioid regimen was Percocet 5 mill grams twice daily as needed.  We also had a discussion about lumbar epidural steroid injections with the patient has received in the past and were moderately helpful for her axial low back and leg pain.  Patient is somewhat apprehensive about these injections however does realize the benefit that she obtained from them  in the past.  I will obtain lumbar MRI and pending results we will discuss treatment plan with the patient.  Plan: -UDS today should be negative for THC. -Decrease Gabapentin to 300 mg qhs. -Meloxicam as below for 30 days.  Patient instructed to discontinue all other NSAIDs while taking meloxicam. -MRI of lumbar spine without contrast -Follow up after MRI to discuss treatment plan  Future considerations: Lumbar ESI which the patient has had in the past which was effective, lumbar facet medial branch nerve blocks, radio frequency ablation  Plan of Care  Pharmacotherapy (Medications Ordered): Meds ordered this encounter  Medications  . meloxicam (MOBIC) 7.5 MG tablet    Sig: Take 1 tablet (7.5 mg total) by mouth daily.    Dispense:  30 tablet    Refill:  1   Lab-work, procedure(s), and/or referral(s): Orders Placed This Encounter  Procedures  . MR LUMBAR SPINE WO CONTRAST  . Compliance Drug Analysis, Ur   Provider-requested follow-up: Return in about 4 weeks (around 12/12/2017) for After Imaging. Time Note: Greater than 50% of the 25 minute(s) of face-to-face time spent with Ms. Torgeson, was spent in counseling/coordination of care regarding: Ms. Brendlinger's primary cause of pain, the treatment plan, treatment alternatives, medication side effects, realistic expectations and the goals of pain management (increased in functionality). Future Appointments  Date Time Provider [place]  12/12/2017  1:30 PM Gillis Santa, MD [facility] None    Primary Care Physician: Rosita Fire, MD Location: [facility] Note by: Gillis Santa, M.D  Date: 11/14/2017; Time: 1:38 PM  Patient Instructions  1. Decrease Gabapentin to 300 mg at night 2. Mobic 7.5 mg daily after meal (anti-inflammatory) 3. Schedule for lumbar MRI 4. Follow up in 3-4 weeks

## 2017-11-14 NOTE — Patient Instructions (Signed)
1. Decrease Gabapentin to 300 mg at night 2. Mobic 7.5 mg daily after meal (anti-inflammatory) 3. Schedule for lumbar MRI 4. Follow up in 3-4 weeks

## 2017-11-19 LAB — COMPLIANCE DRUG ANALYSIS, UR

## 2017-11-28 ENCOUNTER — Ambulatory Visit
Admission: RE | Admit: 2017-11-28 | Discharge: 2017-11-28 | Disposition: A | Discharge status: Home / Self Care | Payer: Medicare HMO | Source: Ambulatory Visit | Attending: Student in an Organized Health Care Education/Training Program | Admitting: Student in an Organized Health Care Education/Training Program

## 2017-11-28 DIAGNOSIS — M5416 Radiculopathy, lumbar region: Secondary | ICD-10-CM | POA: Insufficient documentation

## 2017-11-28 DIAGNOSIS — M1288 Other specific arthropathies, not elsewhere classified, other specified site: Secondary | ICD-10-CM | POA: Insufficient documentation

## 2017-11-28 DIAGNOSIS — M5126 Other intervertebral disc displacement, lumbar region: Secondary | ICD-10-CM | POA: Insufficient documentation

## 2017-11-28 DIAGNOSIS — M48061 Spinal stenosis, lumbar region without neurogenic claudication: Secondary | ICD-10-CM | POA: Diagnosis not present

## 2017-12-12 ENCOUNTER — Ambulatory Visit
Payer: Medicare HMO | Attending: Student in an Organized Health Care Education/Training Program | Admitting: Student in an Organized Health Care Education/Training Program

## 2017-12-12 ENCOUNTER — Encounter: Payer: Medicare HMO | Admitting: Student in an Organized Health Care Education/Training Program

## 2017-12-12 ENCOUNTER — Other Ambulatory Visit: Payer: Self-pay

## 2017-12-12 ENCOUNTER — Encounter: Payer: Self-pay | Admitting: Student in an Organized Health Care Education/Training Program

## 2017-12-12 VITALS — BP 138/94 | HR 64 | Temp 97.7°F | Resp 16 | Ht 67.0 in | Wt 171.0 lb

## 2017-12-12 DIAGNOSIS — J449 Chronic obstructive pulmonary disease, unspecified: Secondary | ICD-10-CM | POA: Insufficient documentation

## 2017-12-12 DIAGNOSIS — G894 Chronic pain syndrome: Secondary | ICD-10-CM | POA: Diagnosis not present

## 2017-12-12 DIAGNOSIS — I25119 Atherosclerotic heart disease of native coronary artery with unspecified angina pectoris: Secondary | ICD-10-CM | POA: Insufficient documentation

## 2017-12-12 DIAGNOSIS — Z888 Allergy status to other drugs, medicaments and biological substances status: Secondary | ICD-10-CM | POA: Insufficient documentation

## 2017-12-12 DIAGNOSIS — M9983 Other biomechanical lesions of lumbar region: Secondary | ICD-10-CM

## 2017-12-12 DIAGNOSIS — F329 Major depressive disorder, single episode, unspecified: Secondary | ICD-10-CM | POA: Diagnosis not present

## 2017-12-12 DIAGNOSIS — Z79899 Other long term (current) drug therapy: Secondary | ICD-10-CM | POA: Insufficient documentation

## 2017-12-12 DIAGNOSIS — F17210 Nicotine dependence, cigarettes, uncomplicated: Secondary | ICD-10-CM | POA: Insufficient documentation

## 2017-12-12 DIAGNOSIS — G5603 Carpal tunnel syndrome, bilateral upper limbs: Secondary | ICD-10-CM | POA: Diagnosis not present

## 2017-12-12 DIAGNOSIS — E785 Hyperlipidemia, unspecified: Secondary | ICD-10-CM | POA: Diagnosis not present

## 2017-12-12 DIAGNOSIS — I252 Old myocardial infarction: Secondary | ICD-10-CM | POA: Diagnosis not present

## 2017-12-12 DIAGNOSIS — Z5181 Encounter for therapeutic drug level monitoring: Secondary | ICD-10-CM | POA: Insufficient documentation

## 2017-12-12 DIAGNOSIS — M5136 Other intervertebral disc degeneration, lumbar region: Secondary | ICD-10-CM | POA: Insufficient documentation

## 2017-12-12 DIAGNOSIS — K219 Gastro-esophageal reflux disease without esophagitis: Secondary | ICD-10-CM | POA: Insufficient documentation

## 2017-12-12 DIAGNOSIS — M47816 Spondylosis without myelopathy or radiculopathy, lumbar region: Secondary | ICD-10-CM

## 2017-12-12 DIAGNOSIS — I10 Essential (primary) hypertension: Secondary | ICD-10-CM | POA: Diagnosis not present

## 2017-12-12 DIAGNOSIS — F419 Anxiety disorder, unspecified: Secondary | ICD-10-CM | POA: Insufficient documentation

## 2017-12-12 DIAGNOSIS — Z7982 Long term (current) use of aspirin: Secondary | ICD-10-CM | POA: Insufficient documentation

## 2017-12-12 DIAGNOSIS — M5416 Radiculopathy, lumbar region: Secondary | ICD-10-CM

## 2017-12-12 DIAGNOSIS — M48061 Spinal stenosis, lumbar region without neurogenic claudication: Secondary | ICD-10-CM | POA: Diagnosis not present

## 2017-12-12 DIAGNOSIS — Z8673 Personal history of transient ischemic attack (TIA), and cerebral infarction without residual deficits: Secondary | ICD-10-CM | POA: Diagnosis not present

## 2017-12-12 MED ORDER — MELOXICAM 7.5 MG PO TABS: 7.5000 mg | ORAL_TABLET | Freq: Every day | ORAL | 2 refills | Status: AC

## 2017-12-12 NOTE — Progress Notes (Signed)
Safety precautions to be maintained throughout the outpatient stay will include: orient to surroundings, keep bed in low position, maintain call bell within reach at all times, provide assistance with transfer out of bed and ambulation.  

## 2017-12-12 NOTE — Progress Notes (Signed)
Patient's Name: Sophia Briggs  MRN: [id number]  Referring Provider: Rosita Fire, MD  DOB: 01/11/1946  PCP: Rosita Fire, MD  DOS: 12/12/2017  Note by: Gillis Santa, MD  Service setting: Ambulatory outpatient  Specialty: Interventional Pain Management  Location: [facility] (AMB) Pain Management Facility    Patient type: Established   Primary Reason(s) for Visit: Encounter for prescription drug management. (Level of risk: moderate)  CC: Back Pain (low)  HPI  Sophia Briggs is a 72 y.o. year old, female patient, who comes today for a medication management evaluation. She has HLD (hyperlipidemia); THROMBOCYTOPENIA; TRANSIENT ISCHEMIC ATTACK; EPISTAXIS; CAD (coronary artery disease); Chest pain; Hyperlipidemia; Smoking history; Angina pectoris ([place]); CTS (carpal tunnel syndrome); Lumbar radiculopathy; Stenosis of lumbosacral spine; and Tobacco abuse on their problem list. Her primarily concern today is the Back Pain (low)  Pain Assessment: Location: Lower Back Radiating: radiates down both legs in the back to calves Onset: More than a month ago Duration: Chronic pain Quality: Constant, Sharp Severity: 5 /10 (self-reported pain score)  Note: Reported level is inconsistent with clinical observations.                         When using our objective Pain Scale, levels between 6 and 10/10 are said to belong in an emergency room, as it progressively worsens from a 6/10, described as severely limiting, requiring emergency care not usually available at an outpatient pain management facility. At a 6/10 level, communication becomes difficult and requires great effort. Assistance to reach the emergency department may be required. Facial flushing and profuse sweating along with potentially dangerous increases in heart rate and blood pressure will be evident. Effect on ADL:   Timing: Constant Modifying factors: medications, procedures, hot packs  Sophia Briggs was last scheduled for an appointment on  11/14/2017 for medication management. During today's appointment we reviewed Sophia Briggs's chronic pain status, as well as her outpatient medication regimen.  Patient returns for follow-up after her lumbar MRI.  Focusing on non-opioid medication management given that patient utilizes marijuana regularly.  The patient  reports that she uses drugs. Drug: Marijuana. Her body mass index is 26.78 kg/m.  Further details on both, my assessment(s), as well as the proposed treatment plan, please see below.  Controlled Substance Pharmacotherapy Assessment REMS (Risk Evaluation and Mitigation Strategy)    Monitoring: [place] PMP: Online review of the past 28-monthperiod conducted. +THC Last UDS on record: Summary  Date Value Ref Range Status  11/14/2017 FINAL  Final    Comment:    ==================================================================== TOXASSURE COMP DRUG ANALYSIS,UR ==================================================================== Test                             Result       Flag       Units Drug Present and Declared for Prescription Verification   Gabapentin                     PRESENT      EXPECTED   Baclofen                       PRESENT      EXPECTED Drug Present not Declared for Prescription Verification   Carboxy-THC                    9  UNEXPECTED ng/mg creat    Carboxy-THC is a metabolite of tetrahydrocannabinol  (THC).    Source of [facility] is most commonly illicit, but THC is also present    in a scheduled prescription medication. Drug Absent but Declared for Prescription Verification   Oxycodone                      Not Detected UNEXPECTED ng/mg creat   Acetaminophen                  Not Detected UNEXPECTED    Acetaminophen, as indicated in the declared medication list, is    not always detected even when used as directed.   Ibuprofen                      Not Detected UNEXPECTED    Ibuprofen, as indicated in the declared medication list, is not    always  detected even when used as directed. ==================================================================== Test                      Result    Flag   Units      Ref Range   Creatinine              354              mg/dL      >=20 ==================================================================== Declared Medications:  The flagging and interpretation on this report are based on the  following declared medications.  Unexpected results may arise from  inaccuracies in the declared medications.  **Note: The testing scope of this panel includes these medications:  Baclofen  Gabapentin  Oxycodone (Roxicet)  **Note: The testing scope of this panel does not include small to  moderate amounts of these reported medications:  Acetaminophen (Roxicet)  Ibuprofen  **Note: The testing scope of this panel does not include following  reported medications:  Carvedilol (Coreg)  Cholecalciferol  Isosorbide Mononitrate  Lovastatin (Mevacor)  Meloxicam (Mobic)  Nitroglycerin (Nitrostat)  Pantoprazole (Protonix) ==================================================================== For clinical consultation, please call [phone]. ====================================================================    UDS interpretation: Non-Compliant          Medication Assessment Form: Discrepancies found between patient's report and information collected Treatment compliance: Compliant  Risk of substance use disorder (SUD): Low Opioid Risk Tool - 11/14/17 1144      Family History of Substance Abuse   Alcohol  Negative    Illegal Drugs  Negative    Rx Drugs  Negative      Personal History of Substance Abuse   Alcohol  Negative    Illegal Drugs  Negative    Rx Drugs  Negative      Age   Age between 70-72 years   No      Psychological Disease   Psychological Disease  Negative    Depression  Negative      Total Score   Opioid Risk Tool Scoring  0    Opioid Risk Interpretation  Low Risk       Non-opioid management given active and regular marijuana use.  Laboratory Chemistry  Inflammation Markers (CRP: Acute Phase) (ESR: Chronic Phase) No results found for: CRP, ESRSEDRATE, LATICACIDVEN                       Rheumatology Markers No results found for: RF, ANA, LABURIC, URICUR, LYMEIGGIGMAB, LYMEABIGMQN  Renal Function Markers Lab Results  Component Value Date   BUN 14 06/22/2017   CREATININE 0.91 06/22/2017   GFRAA 75 06/22/2017   GFRNONAA 64 06/22/2017                 Hepatic Function Markers Lab Results  Component Value Date   AST 17 06/22/2017   ALT 11 06/22/2017   ALBUMIN 3.4 04/16/2013   ALKPHOS 91 04/16/2013   LIPASE 131 04/16/2013                 Electrolytes Lab Results  Component Value Date   NA 139 06/22/2017   K 4.1 06/22/2017   CL 108 06/22/2017   CALCIUM 9.0 06/22/2017   MG 1.8 04/16/2013                        Neuropathy Markers No results found for: VITAMINB12, FOLATE, HGBA1C, HIV               Bone Pathology Markers Lab Results  Component Value Date   VD25OH 49 06/22/2017                         Coagulation Parameters Lab Results  Component Value Date   INR 1.0 04/16/2013   LABPROT 13.3 04/16/2013   PLT 160 06/22/2017                 Cardiovascular Markers Lab Results  Component Value Date   CKTOTAL 154 04/16/2013   CKMB 6.4 (H) 04/16/2013   TROPONINI 1.20 (H) 04/16/2013   HGB 13.2 06/22/2017   HCT 40.0 06/22/2017                 CA Markers No results found for: CEA, CA125, LABCA2               Note: Lab results reviewed.  Recent Diagnostic Imaging Results  MR LUMBAR SPINE WO CONTRAST CLINICAL DATA:  Chronic low back pain.  Bilateral leg pain  EXAM: MRI LUMBAR SPINE WITHOUT CONTRAST  TECHNIQUE: Multiplanar, multisequence MR imaging of the lumbar spine was performed. No intravenous contrast was administered.  COMPARISON:  None.  FINDINGS: Segmentation:  Standard.  Alignment:   Physiologic.  Vertebrae:  No fracture, evidence of discitis, or bone lesion.  Conus medullaris and cauda equina: Conus extends to the T12-L1 level. Conus and cauda equina appear normal.  Paraspinal and other soft tissues: No acute paraspinal abnormality.  Disc levels:  Disc spaces: Disc desiccation with mild disc height loss at L4-5 and L5-S1.  T12-L1: Mild broad-based disc bulge. No evidence of neural foraminal stenosis. No central canal stenosis. Mild bilateral facet arthropathy.  L1-L2: Mild broad-based disc bulge. Mild bilateral facet arthropathy. No evidence of neural foraminal stenosis. No central canal stenosis.  L2-L3: Broad-based disc bulge flattening the ventral thecal sac with a small central disc protrusion. Moderate bilateral facet arthropathy. Moderate-severe spinal stenosis. No foraminal stenosis.  L3-L4: Broad-based disc bulge with a small central disc protrusion. Moderate bilateral facet arthropathy. Moderate-severe spinal stenosis. No foraminal stenosis.  L4-L5: Broad-based disc bulge eccentric towards the right. Moderate bilateral facet arthropathy. Right lateral recess stenosis. Mild spinal stenosis. Mild bilateral foraminal stenosis.  L5-S1: Broad-based disc bulge. Moderate bilateral facet arthropathy. No evidence of neural foraminal stenosis. No central canal stenosis.  IMPRESSION: 1. At L2-3 there is a broad-based disc bulge flattening the ventral thecal sac with a small central disc protrusion. Moderate bilateral facet  arthropathy. Moderate-severe spinal stenosis. 2. At L3-4 there is a broad-based disc bulge with a small central disc protrusion. Moderate bilateral facet arthropathy. Moderate-severe spinal stenosis. 3. At L4-5 there is a broad-based disc bulge eccentric towards the right. Moderate bilateral facet arthropathy. Right lateral recess stenosis. Mild spinal stenosis. Mild bilateral foraminal stenosis.  Electronically Signed   By:  Kathreen Devoid   On: 11/28/2017 11:14  Complexity Note: Imaging results reviewed. Results shared with Sophia Briggs, using Layman's terms. Today I personally and independently reviewed the study images pertinent to Sophia Briggs's problem.                  Meds   Current Outpatient Medications:  .  aspirin 81 MG tablet, Take 81 mg by mouth daily., Disp: , Rfl:  .  atorvastatin (LIPITOR) 40 MG tablet, Take 40 mg by mouth daily., Disp: , Rfl:  .  baclofen (LIORESAL) 10 MG tablet, Take 1 tablet (10 mg total) by mouth 2 (two) times daily as needed for muscle spasms., Disp: 60 tablet, Rfl: 0 .  carvedilol (COREG) 25 MG tablet, Take 1 tablet (25 mg total) by mouth 2 (two) times daily with a meal., Disp: 180 tablet, Rfl: 3 .  cholecalciferol (VITAMIN D) 1000 UNITS tablet, Take 1,000 Units by mouth 2 (two) times daily., Disp: , Rfl:  .  gabapentin (NEURONTIN) 300 MG capsule, 300 mg qhs x 2 weeks then 300 mg BID, Disp: 60 capsule, Rfl: 2 .  ibuprofen (IBU) 800 MG tablet, Take 1 tablet (800 mg total) every 8 (eight) hours as needed by mouth., Disp: 30 tablet, Rfl: 0 .  isosorbide mononitrate (IMDUR) 30 MG 24 hr tablet, Take 1 tablet (30 mg total) by mouth daily., Disp: 90 tablet, Rfl: 3 .  meloxicam (MOBIC) 7.5 MG tablet, Take 1 tablet (7.5 mg total) by mouth daily., Disp: 30 tablet, Rfl: 2 .  nitroGLYCERIN (NITROSTAT) 0.4 MG SL tablet, Place 0.4 mg under the tongue every 5 (five) minutes as needed., Disp: , Rfl:  .  pantoprazole (PROTONIX) 40 MG tablet, Take 1 tablet (40 mg total) by mouth daily., Disp: 90 tablet, Rfl: 3  ROS  Constitutional: Denies any fever or chills Gastrointestinal: No reported hemesis, hematochezia, vomiting, or acute GI distress Musculoskeletal: Denies any acute onset joint swelling, redness, loss of ROM, or weakness Neurological: No reported episodes of acute onset apraxia, aphasia, dysarthria, agnosia, amnesia, paralysis, loss of coordination, or loss of consciousness  Allergies   Sophia Briggs is allergic to amlodipine; atorvastatin; and clopidogrel bisulfate.  PFSH  Drug: Sophia Briggs  reports that she uses drugs. Drug: Marijuana. Alcohol:  reports that she does not drink alcohol. Tobacco:  reports that she has been smoking cigarettes.  She has smoked for the past 10.00 years. she has never used smokeless tobacco. Medical:  has a past medical history of Acute MI ([place]), Allergy, Anxiety, Arthritis, Back pain, COPD (chronic obstructive pulmonary disease) ([place]), Coronary artery disease, CTS (carpal tunnel syndrome) (01/06/2012), GERD (gastroesophageal reflux disease), Hyperlipidemia, Hypertension, Stroke ([place]), Syncope and collapse, and TIA (transient ischemic attack). Surgical: Sophia Briggs  has a past surgical history that includes Cardiac catheterization; Tubal ligation; and Parotidectomy. Family: family history includes Aneurysm in her sister; Arthritis in her father and mother; Cancer in her father; Early death in her son; Heart disease (age of onset: 79) in her brother; Hypertension in her mother. She was adopted.  Constitutional Exam  General appearance: Well nourished, well developed, and well hydrated. In no apparent  acute distress Vitals:   12/12/17 0915  BP: (!) 138/94  Pulse: 64  Resp: 16  Temp: 97.7 F (36.5 C)  SpO2: 100%  Weight: 171 lb (77.6 kg)  Height: '5\' 7"'$  (1.702 m)   BMI Assessment: Estimated body mass index is 26.78 kg/m as calculated from the following:   Height as of this encounter: '5\' 7"'$  (1.702 m).   Weight as of this encounter: 171 lb (77.6 kg).  BMI interpretation table: BMI level Category Range association with higher incidence of chronic pain  <18 kg/m2 Underweight   18.5-24.9 kg/m2 Ideal body weight   25-29.9 kg/m2 Overweight Increased incidence by 20%  30-34.9 kg/m2 Obese (Class I) Increased incidence by 68%  35-39.9 kg/m2 Severe obesity (Class II) Increased incidence by 136%  >40 kg/m2 Extreme obesity (Class III) Increased  incidence by 254%   BMI Readings from Last 4 Encounters:  12/12/17 26.78 kg/m  11/14/17 26.63 kg/m  10/09/17 27.72 kg/m  08/22/17 28.66 kg/m   Wt Readings from Last 4 Encounters:  12/12/17 171 lb (77.6 kg)  11/14/17 170 lb (77.1 kg)  10/09/17 177 lb (80.3 kg)  08/22/17 183 lb (83 kg)  Psych/Mental status: Alert, oriented x 3 (person, place, & time)       Eyes: PERLA Respiratory: No evidence of acute respiratory distress  Cervical Spine Area Exam  Skin & Axial Inspection: No masses, redness, edema, swelling, or associated skin lesions Alignment: Symmetrical Functional ROM: Unrestricted ROM      Stability: No instability detected Muscle Tone/Strength: Functionally intact. No obvious neuro-muscular anomalies detected. Sensory (Neurological): Unimpaired Palpation: No palpable anomalies              Upper Extremity (UE) Exam    Side: Right upper extremity  Side: Left upper extremity  Skin & Extremity Inspection: Skin color, temperature, and hair growth are WNL. No peripheral edema or cyanosis. No masses, redness, swelling, asymmetry, or associated skin lesions. No contractures.  Skin & Extremity Inspection: Skin color, temperature, and hair growth are WNL. No peripheral edema or cyanosis. No masses, redness, swelling, asymmetry, or associated skin lesions. No contractures.  Functional ROM: Unrestricted ROM          Functional ROM: Unrestricted ROM          Muscle Tone/Strength: Functionally intact. No obvious neuro-muscular anomalies detected.  Muscle Tone/Strength: Functionally intact. No obvious neuro-muscular anomalies detected.  Sensory (Neurological): Unimpaired          Sensory (Neurological): Unimpaired          Palpation: No palpable anomalies              Palpation: No palpable anomalies              Specialized Test(s): Deferred         Specialized Test(s): Deferred          Thoracic Spine Area Exam  Skin & Axial Inspection: No masses, redness, or swelling Alignment:  Symmetrical Functional ROM: Unrestricted ROM Stability: No instability detected Muscle Tone/Strength: Functionally intact. No obvious neuro-muscular anomalies detected. Sensory (Neurological): Unimpaired Muscle strength & Tone: No palpable anomalies  Lumbar Spine Area Exam  Skin & Axial Inspection: No masses, redness, or swelling Alignment: Symmetrical Functional ROM: Decreased ROM      Stability: No instability detected Muscle Tone/Strength: Functionally intact. No obvious neuro-muscular anomalies detected. Sensory (Neurological): Unimpaired Palpation: No palpable anomalies       Provocative Tests: Lumbar Hyperextension and rotation test: Positive bilaterally for facet joint pain.  Lumbar Lateral bending test: Positive ipsilateral radicular pain, bilaterally. Positive for bilateral foraminal stenosis. Patrick's Maneuver: Positive for bilateral S-I arthralgia              Gait & Posture Assessment  Ambulation: Unassisted Gait: Relatively normal for age and body habitus Posture: WNL   Lower Extremity Exam    Side: Right lower extremity  Side: Left lower extremity  Skin & Extremity Inspection: Skin color, temperature, and hair growth are WNL. No peripheral edema or cyanosis. No masses, redness, swelling, asymmetry, or associated skin lesions. No contractures.  Skin & Extremity Inspection: Skin color, temperature, and hair growth are WNL. No peripheral edema or cyanosis. No masses, redness, swelling, asymmetry, or associated skin lesions. No contractures.  Functional ROM: Unrestricted ROM          Functional ROM: Unrestricted ROM          Muscle Tone/Strength: Functionally intact. No obvious neuro-muscular anomalies detected.  Muscle Tone/Strength: Functionally intact. No obvious neuro-muscular anomalies detected.  Sensory (Neurological): Unimpaired  Sensory (Neurological): Unimpaired  Palpation: No palpable anomalies  Palpation: No palpable anomalies   Assessment  Primary Diagnosis &  Pertinent Problem List: The primary encounter diagnosis was Lumbar radiculopathy. Diagnoses of Lumbar foraminal stenosis, Lumbar facet arthropathy, Lumbar spondylosis, Lumbar degenerative disc disease, and Chronic pain syndrome were also pertinent to this visit.  Status Diagnosis  Worsening Worsening Worsening 1. Lumbar radiculopathy   2. Lumbar foraminal stenosis   3. Lumbar facet arthropathy   4. Lumbar spondylosis   5. Lumbar degenerative disc disease   6. Chronic pain syndrome      General Recommendations: The pain condition that the patient suffers from is best treated with a multidisciplinary approach that involves an increase in physical activity to prevent de-conditioning and worsening of the pain cycle, as well as psychological counseling (formal and/or informal) to address the co-morbid psychological affects of pain. Treatment will often involve judicious use of pain medications and interventional procedures to decrease the pain, allowing the patient to participate in the physical activity that will ultimately produce long-lasting pain reductions. The goal of the multidisciplinary approach is to return the patient to a higher level of overall function and to restore their ability to perform activities of daily living.  72 year old female with an history of axial low back pain that radiates into bilateral lower extremities to her calves secondary to lumbar degenerative disc disease, lumbar radiculopathy, lumbar spondylosis most pronounced at L4/5, L5/S1 as evidenced on patient's MRI from 2009.  Given worsening axial low back pain as well as occasional weakness of her lower extremities, repeat MRI was ordered at the last visit which shows disc herniation at L2/3 with moderate bilateral facet arthropathy and moderate to severe spinal stenosis, L3-L4 with moderate bilateral facet arthropathy and moderate to severe spinal stenosis and at L4-L5 moderate bilateral facet arthropathy along with  right greater than left lateral recess stenosis and mild bilateral foraminal stenosis.  Treatment options were discussed with the patient.  Given that her urine drug screen was still positive for THC, I notified the patient that she would not be a candidate for opioid therapy at our clinic.  However we discussed interventional options such as lumbar epidural steroid injection for her lumbar radiculopathy.  She states that she has had epidural injections before which were helpful.  I also discussed lumbar facet medial branch nerve blocks at L3, L4, L5 for her lumbar spondylosis and facet arthropathy as evident on lumbar MRI and physical exam findings  suggestive of facet arthritis given pain with lumbar extension and lateral rotation.  Otherwise patient continues gabapentin 300 mg nightly without any significant side effects and gabapentin as needed for muscle spasms.  She also continues Mobic 7.5 mg daily.  Plan: -Continue gabapentin 300 mg nightly, Mobic 7.5 mill grams daily. -Continue baclofen 10 mg twice daily to 3 times daily as needed -Lumbar epidural steroid injection as needed for lumbar radiculopathy  Plan of Care  Pharmacotherapy (Medications Ordered): Meds ordered this encounter  Medications  . meloxicam (MOBIC) 7.5 MG tablet    Sig: Take 1 tablet (7.5 mg total) by mouth daily.    Dispense:  30 tablet    Refill:  2   Lab-work, procedure(s), and/or referral(s): Orders Placed This Encounter  Procedures  . Lumbar Epidural Injection    Pharmacological management options:  Opioid Analgesics: I will not be prescribing any opioids at this time UDS + MJ (patient did not want to repeat today) Membrane stabilizer: Gabapentin Muscle relaxant: Currently on an appropriate regimen NSAID: Currently on an appropriate regimen Other analgesic(s): To be determined at a later time   Interventional management options:  Considering:   Lumbar ESI which the patient has had in the past which was  effective, lumbar facet medial branch nerve blocks, radio frequency ablation    Provider-requested follow-up: Return in about 6 weeks (around 01/23/2018). Time Note: Greater than 50% of the 25 minute(s) of face-to-face time spent with Sophia Briggs, was spent in counseling/coordination of care regarding: Sophia Briggs's primary cause of pain, the results of her recent test(s), the significance of each one oth the test(s) anomalies and it's corresponding characteristic pain pattern(s), the treatment plan, treatment alternatives, the risks and possible complications of proposed treatment, the appropriate use of her medications, realistic expectations and the patient's responsibilities when it comes to controlled substances. No future appointments.  Primary Care Physician: Rosita Fire, MD Location: [facility] Out[facility] Note by: Gillis Santa, M.D Date: 12/12/2017; Time: 9:34 AM  There are no Patient Instructions on file for this visit.

## 2017-12-12 NOTE — Patient Instructions (Signed)
Preparing for your procedure (without sedation) Instructions: Oral Intake: Do not eat or drink anything for at least 3 hours prior to your procedure. Transportation: Unless otherwise stated by your physician, you may drive yourself after the procedure. Blood Pressure Medicine: Take your blood pressure medicine with a sip of water the morning of the procedure. Insulin: Take only  of your normal insulin dose. Preventing infections: Shower with an antibacterial soap the morning of your procedure. Build-up your immune system: Take 1000 mg of Vitamin C with every meal (3 times a day) the day prior to your procedure. Pregnancy: If you are pregnant, call and cancel the procedure. Sickness: If you have a cold, fever, or any active infections, call and cancel the procedure. Arrival: You must be in the facility at least 30 minutes prior to your scheduled procedure. Children: Do not bring any children with you. Dress appropriately: Bring dark clothing that you would not mind if they get stained. Valuables: Do not bring any jewelry or valuables. Procedure appointments are reserved for interventional treatments only. No Prescription Refills. No medication changes will be discussed during procedure appointments. No disability issues will be discussed. ____________________________________________________________________________________________  Epidural Steroid Injection  An epidural steroid injection is given to relieve pain in your neck, back, or legs that is caused by the irritation or swelling of a nerve root. This procedure involves injecting a steroid and numbing medicine (anesthetic) into the epidural space. The epidural space is the space between the outer covering of your spinal cord and the bones that form your backbone (vertebra).  LET YOUR HEALTH CARE PROVIDER KNOW ABOUT:  Any allergies you have. All medicines you are taking, including vitamins, herbs, eye drops, creams, and over-the-counter  medicines such as aspirin. Previous problems you or members of your family have had with the use of anesthetics. Any blood disorders or blood clotting disorders you have. Previous surgeries you have had. Medical conditions you have.  RISKS AND COMPLICATIONS Generally, this is a safe procedure. However, as with any procedure, complications can occur. Possible complications of epidural steroid injection include: Headache. Bleeding. Infection. Allergic reaction to the medicines. Damage to your nerves. The response to this procedure depends on the underlying cause of the pain and its duration. People who have long-term (chronic) pain are less likely to benefit from epidural steroids than are those people whose pain comes on strong and suddenly.  BEFORE THE PROCEDURE  Ask your health care provider about changing or stopping your regular medicines. You may be advised to stop taking blood-thinning medicines a few days before the procedure. You may be given medicines to reduce anxiety. Arrange for someone to take you home after the procedure.  PROCEDURE  You will remain awake during the procedure. You may receive medicine to make you relaxed. You will be asked to lie on your stomach. The injection site will be cleaned. The injection site will be numbed with a medicine (local anesthetic). A needle will be injected through your skin into the epidural space. Your health care provider will use an X-ray machine to ensure that the steroid is delivered closest to the affected nerve. You may have minimal discomfort at this time. Once the needle is in the right position, the local anesthetic and the steroid will be injected into the epidural space. The needle will then be removed and a bandage will be applied to the injection site.  AFTER THE PROCEDURE  You may be monitored for a short time before you go home. You may   feel weakness or numbness in your arm or leg, which disappears within hours. You  may be allowed to eat, drink, and take your regular medicine. You may have soreness at the site of the injection.   This information is not intended to replace advice given to you by your health care provider. Make sure you discuss any questions you have with your health care provider.   Document Released: 12/27/2007 Document Revised: 05/22/2013 Document Reviewed: 03/08/2013 [employer] Interactive Patient Education 2016 [employer]  GENERAL RISKS AND COMPLICATIONS  What are the risk, side effects and possible complications? Generally speaking, most procedures are safe.  However, with any procedure there are risks, side effects, and the possibility of complications.  The risks and complications are dependent upon the sites that are lesioned, or the type of nerve block to be performed.  The closer the procedure is to the spine, the more serious the risks are.  Great care is taken when placing the radio frequency needles, block needles or lesioning probes, but sometimes complications can occur. Infection: Any time there is an injection through the skin, there is a risk of infection.  This is why sterile conditions are used for these blocks. There are four possible types of infection: 1. Localized skin infection. 2. Central Nervous System Infection: This can be in the form of Meningitis, which can be deadly. 3. Epidural Infections: This can be in the form of an epidural abscess, which can cause pressure inside of the spine, causing compression of the spinal cord with subsequent paralysis. This would require an emergency surgery to decompress, and there are no guarantees that the patient would recover from the paralysis. 4. Discitis: This is an infection of the intervertebral discs. It occurs in about 1% of discography procedures. It is difficult to treat and it may lead to surgery. Pain: the needles have to go through skin and soft tissues, will cause soreness. Damage to internal structures:  The  nerves to be lesioned may be near blood vessels or other nerves which can be potentially damaged. Bleeding: Bleeding is more common if the patient is taking blood thinners such as  aspirin, Coumadin, Ticiid, Plavix, etc., or if he/she have some genetic predisposition such as hemophilia. Bleeding into the spinal canal can cause compression of the spinal  cord with subsequent paralysis.  This would require an emergency surgery to decompress and there are no guarantees that the patient would recover from the paralysis. Pneumothorax: Puncturing of a lung is a possibility, every time a needle is introduced in the area of the chest or upper back.  Pneumothorax refers to free air around the collapsed lung(s), inside of the thoracic cavity (chest cavity).  Another two possible complications related to a similar event would include: Hemothorax and Chylothorax. These are variations of the Pneumothorax, where instead of air around the collapsed lung(s), you may have blood or chyle, respectively. Spinal headaches: They may occur with any procedures in the area of the spine. Persistent CSF (Cerebro-Spinal Fluid) leakage: This is a rare problem, but may occur with prolonged intrathecal or epidural catheters either due to the formation of a fistulous track or a dural tear. Nerve damage: By working so close to the spinal cord, there is always a possibility of nerve damage, which could be as serious as a permanent spinal cord injury with paralysis. Death: Although rare, severe deadly allergic reactions known as "Anaphylactic reaction" can occur to any of the medications used. Worsening of the symptoms: We can always make   thing worse.  What are the chances of something like this happening? Chances of any of this occuring are extremely low.  By statistics, you have more of a chance of getting killed in a motor vehicle accident: while driving to the hospital than any of the above occurring .  Nevertheless, you should be aware  that they are possibilities.  In general, it is similar to taking a shower.  Everybody knows that you can slip, hit your head and get killed.  Does that mean that you should not shower again?  Nevertheless always keep in mind that statistics do not mean anything if you happen to be on the wrong side of them.  Even if a procedure has a 1 (one) in a 1,000,000 (million) chance of going wrong, it you happen to be that one..Also, keep in mind that by statistics, you have more of a chance of having something go wrong when taking medications.  Who should not have this procedure? If you are on a blood thinning medication (e.g. Coumadin, Plavix, see list of "Blood Thinners"), or if you have an active infection going on, you should not have the procedure.  If you are taking any blood thinners, please inform your physician.  Preparing for your procedure: Do not eat or drink anything at least eight (8) hours prior to the procedure. Bring a driver with you .  It cannot be a taxi. Come accompanied by an adult that can drive you back, and that is strong enough to help you if your legs get weak or numb from the local anesthetic. Take all of your medicines the morning of the procedure with just enough water to swallow them. If you have diabetes, make sure that you are scheduled to have your procedure done first thing in the morning, whenever possible. If you have diabetes, take only half of your insulin dose and notify our nurse that you have done so as soon as you arrive at the clinic. If you are diabetic, but only take blood sugar pills (oral hypoglycemic), then do not take them on the morning of your procedure.  You may take them after you have had the procedure. Do not take aspirin or any aspirin-containing medications, at least eleven (11) days prior to the procedure.  They may prolong bleeding. Wear loose fitting clothing that may be easy to take off and that you would not mind if it got stained with Betadine or  blood. Do not wear any jewelry or perfume Remove any nail coloring.  It will interfere with some of our monitoring equipment. If you take Metformin for your diabetes, stop it 48 hours prior to the procedure.  NOTE: Remember that this is not meant to be interpreted as a complete list of all possible complications.  Unforeseen problems may occur.  BLOOD THINNERS The following drugs contain aspirin or other products, which can cause increased bleeding during surgery and should not be taken for 2 weeks prior to and 1 week after surgery.  If you should need take something for relief of minor pain, you may take acetaminophen which is found in Tylenol,m Datril, Anacin-3 and Panadol. It is not blood thinner. The products listed below are.  Do not take any of the products listed below in addition to any listed on your instruction sheet.  A.P.C or A.P.C with Codeine Codeine Phosphate Capsules #3 Ibuprofen Ridaura  ABC compound Congesprin Imuran rimadil  Advil Cope Indocin Robaxisal  Alka-Seltzer Effervescent Pain Reliever and Antacid Coricidin or   Coricidin-D  Indomethacin Rufen  Alka-Seltzer plus Cold Medicine Cosprin Ketoprofen S-A-C Tablets  Anacin Analgesic Tablets or Capsules Coumadin Korlgesic Salflex  Anacin Extra Strength Analgesic tablets or capsules CP-2 Tablets Lanoril Salicylate  Anaprox Cuprimine Capsules Levenox Salocol  Anexsia-D Dalteparin Magan Salsalate  Anodynos Darvon compound Magnesium Salicylate Sine-off  Ansaid Dasin Capsules Magsal Sodium Salicylate  Anturane Depen Capsules Marnal Soma  APF Arthritis pain formula Dewitt's Pills Measurin Stanback  Argesic Dia-Gesic Meclofenamic Sulfinpyrazone  Arthritis Bayer Timed Release Aspirin Diclofenac Meclomen Sulindac  Arthritis pain formula Anacin Dicumarol Medipren Supac  Analgesic (Safety coated) Arthralgen Diffunasal Mefanamic Suprofen  Arthritis Strength Bufferin Dihydrocodeine Mepro Compound Suprol  Arthropan liquid Dopirydamole  Methcarbomol with Aspirin Synalgos  ASA tablets/Enseals Disalcid Micrainin Tagament  Ascriptin Doan's Midol Talwin  Ascriptin A/D Dolene Mobidin Tanderil  Ascriptin Extra Strength Dolobid Moblgesic Ticlid  Ascriptin with Codeine Doloprin or Doloprin with Codeine Momentum Tolectin  Asperbuf Duoprin Mono-gesic Trendar  Aspergum Duradyne Motrin or Motrin IB Triminicin  Aspirin plain, buffered or enteric coated Durasal Myochrisine Trigesic  Aspirin Suppositories Easprin Nalfon Trillsate  Aspirin with Codeine Ecotrin Regular or Extra Strength Naprosyn Uracel  Atromid-S Efficin Naproxen Ursinus  Auranofin Capsules Elmiron Neocylate Vanquish  Axotal Emagrin Norgesic Verin  Azathioprine Empirin or Empirin with Codeine Normiflo Vitamin E  Azolid Emprazil Nuprin Voltaren  Bayer Aspirin plain, buffered or children's or timed BC Tablets or powders Encaprin Orgaran Warfarin Sodium  Buff-a-Comp Enoxaparin Orudis Zorpin  Buff-a-Comp with Codeine Equegesic Os-Cal-Gesic   Buffaprin Excedrin plain, buffered or Extra Strength Oxalid   Bufferin Arthritis Strength Feldene Oxphenbutazone   Bufferin plain or Extra Strength Feldene Capsules Oxycodone with Aspirin   Bufferin with Codeine Fenoprofen Fenoprofen Pabalate or Pabalate-SF   Buffets II Flogesic Panagesic   Buffinol plain or Extra Strength Florinal or Florinal with Codeine Panwarfarin   Buf-Tabs Flurbiprofen Penicillamine   Butalbital Compound Four-way cold tablets Penicillin   Butazolidin Fragmin Pepto-Bismol   Carbenicillin Geminisyn Percodan   Carna Arthritis Reliever Geopen Persantine   Carprofen Gold's salt Persistin   Chloramphenicol Goody's Phenylbutazone   Chloromycetin Haltrain Piroxlcam   Clmetidine heparin Plaquenil   Cllnoril Hyco-pap Ponstel   Clofibrate Hydroxy chloroquine Propoxyphen         Before stopping any of these medications, be sure to consult the physician who ordered them.  Some, such as Coumadin (Warfarin) are ordered  to prevent or treat serious conditions such as "deep thrombosis", "pumonary embolisms", and other heart problems.  The amount of time that you may need off of the medication may also vary with the medication and the reason for which you were taking it.  If you are taking any of these medications, please make sure you notify your pain physician before you undergo any procedures. ____________________________________________________________________________________________  

## 2018-01-10 ENCOUNTER — Ambulatory Visit
Admission: RE | Admit: 2018-01-10 | Discharge: 2018-01-10 | Disposition: A | Discharge status: Home / Self Care | Payer: Medicare HMO | Source: Ambulatory Visit | Attending: Student in an Organized Health Care Education/Training Program | Admitting: Student in an Organized Health Care Education/Training Program

## 2018-01-10 ENCOUNTER — Other Ambulatory Visit: Payer: Self-pay

## 2018-01-10 ENCOUNTER — Ambulatory Visit (HOSPITAL_BASED_OUTPATIENT_CLINIC_OR_DEPARTMENT_OTHER): Payer: Medicare HMO | Admitting: Student in an Organized Health Care Education/Training Program

## 2018-01-10 ENCOUNTER — Encounter: Payer: Self-pay | Admitting: Student in an Organized Health Care Education/Training Program

## 2018-01-10 DIAGNOSIS — Z888 Allergy status to other drugs, medicaments and biological substances status: Secondary | ICD-10-CM | POA: Insufficient documentation

## 2018-01-10 DIAGNOSIS — M5416 Radiculopathy, lumbar region: Secondary | ICD-10-CM | POA: Insufficient documentation

## 2018-01-10 DIAGNOSIS — Z7982 Long term (current) use of aspirin: Secondary | ICD-10-CM | POA: Diagnosis not present

## 2018-01-10 DIAGNOSIS — Z79899 Other long term (current) drug therapy: Secondary | ICD-10-CM | POA: Insufficient documentation

## 2018-01-10 MED ORDER — ROPIVACAINE HCL 2 MG/ML IJ SOLN
2.0000 mL | Freq: Once | INTRAMUSCULAR | Status: AC
  Administered 2018-01-10: 10 mL via EPIDURAL
  Filled 2018-01-10: qty 10

## 2018-01-10 MED ORDER — LACTATED RINGERS IV SOLN
1000.0000 mL | Freq: Once | INTRAVENOUS | Status: AC
  Administered 2018-01-10: 1000 mL via INTRAVENOUS

## 2018-01-10 MED ORDER — IOPAMIDOL (ISOVUE-M 200) INJECTION 41%
10.0000 mL | Freq: Once | INTRAMUSCULAR | Status: AC
  Administered 2018-01-10: 10 mL via EPIDURAL
  Filled 2018-01-10: qty 10

## 2018-01-10 MED ORDER — SODIUM CHLORIDE 0.9 % IJ SOLN
INTRAMUSCULAR | Status: AC
  Filled 2018-01-10: qty 10

## 2018-01-10 MED ORDER — SODIUM CHLORIDE 0.9% FLUSH
2.0000 mL | Freq: Once | INTRAVENOUS | Status: AC
  Administered 2018-01-10: 10 mL

## 2018-01-10 MED ORDER — DEXAMETHASONE SODIUM PHOSPHATE 10 MG/ML IJ SOLN
10.0000 mg | Freq: Once | INTRAMUSCULAR | Status: AC
  Administered 2018-01-10: 10 mg
  Filled 2018-01-10: qty 1

## 2018-01-10 MED ORDER — FENTANYL CITRATE (PF) 100 MCG/2ML IJ SOLN
25.0000 ug | INTRAMUSCULAR | Status: DC | PRN
  Administered 2018-01-10: 50 ug via INTRAVENOUS
  Filled 2018-01-10: qty 2

## 2018-01-10 MED ORDER — LIDOCAINE HCL (PF) 1 % IJ SOLN
4.5000 mL | Freq: Once | INTRAMUSCULAR | Status: AC
  Administered 2018-01-10: 5 mL
  Filled 2018-01-10: qty 5

## 2018-01-10 NOTE — Progress Notes (Signed)
Safety precautions to be maintained throughout the outpatient stay will include: orient to surroundings, keep bed in low position, maintain call bell within reach at all times, provide assistance with transfer out of bed and ambulation.  

## 2018-01-10 NOTE — Progress Notes (Signed)
Patient's Name: Sophia Briggs  MRN: [id number]  Referring Provider: Gillis Santa, MD  DOB: Sep 28, 1946  PCP: Rosita Fire, MD  DOS: 01/10/2018  Note by: Gillis Santa, MD  Service setting: Ambulatory outpatient  Specialty: Interventional Pain Management  Patient type: Established  Location: [facility] (AMB) Pain Management Facility  Visit type: Interventional Procedure   Primary Reason for Visit: Interventional Pain Management Treatment. CC: Back Pain (low)  Procedure:       Anesthesia, Analgesia, Anxiolysis:  Type: Therapeutic Inter-Laminar Epidural Steroid Injection #1  Region: Lumbar Level: L5-S1 Level. Laterality: Midline         Type: Moderate (Conscious) Sedation combined with Local Anesthesia Indication(s): Analgesia and Anxiety Route: Intravenous (IV) IV Access: Secured Sedation: Meaningful verbal contact was maintained at all times during the procedure  Local Anesthetic: Lidocaine 1-2%   Indications: 1. Lumbar radiculopathy    Pain Score: Pre-procedure: 7 /10 Post-procedure: 0-No pain/10  Pre-op Assessment:  Ms. Lodato is a 72 y.o. (year old), female patient, seen today for interventional treatment. She  has a past surgical history that includes Cardiac catheterization; Tubal ligation; and Parotidectomy. Ms. Mcgeehan has a current medication list which includes the following prescription(s): aspirin, atorvastatin, baclofen, carvedilol, cholecalciferol, gabapentin, ibuprofen, isosorbide mononitrate, meloxicam, nitroglycerin, and pantoprazole, and the following Facility-Administered Medications: fentanyl. Her primarily concern today is the Back Pain (low)  Initial Vital Signs:  Pulse Rate: 60 Temp: 97.7 F (36.5 C) Resp: 18 BP: (!) 177/98 SpO2: 100 %  BMI: Estimated body mass index is 26.94 kg/m as calculated from the following:   Height as of this encounter: 5\' 7"  (1.702 m).   Weight as of this encounter: 172 lb (78 kg).  Risk Assessment: Allergies: Reviewed. She  is allergic to amlodipine; atorvastatin; and clopidogrel bisulfate.  Allergy Precautions: None required Coagulopathies: Reviewed. None identified.  Blood-thinner therapy: None at this time Active Infection(s): Reviewed. None identified. Ms. Gracey is afebrile  Site Confirmation: Ms. Stavros was asked to confirm the procedure and laterality before marking the site Procedure checklist: Completed Consent: Before the procedure and under the influence of no sedative(s), amnesic(s), or anxiolytics, the patient was informed of the treatment options, risks and possible complications. To fulfill our ethical and legal obligations, as recommended by the American Medical Association's Code of Ethics, I have informed the patient of my clinical impression; the nature and purpose of the treatment or procedure; the risks, benefits, and possible complications of the intervention; the alternatives, including doing nothing; the risk(s) and benefit(s) of the alternative treatment(s) or procedure(s); and the risk(s) and benefit(s) of doing nothing. The patient was provided information about the general risks and possible complications associated with the procedure. These may include, but are not limited to: failure to achieve desired goals, infection, bleeding, organ or nerve damage, allergic reactions, paralysis, and death. In addition, the patient was informed of those risks and complications associated to Spine-related procedures, such as failure to decrease pain; infection (i.e.: Meningitis, epidural or intraspinal abscess); bleeding (i.e.: epidural hematoma, subarachnoid hemorrhage, or any other type of intraspinal or peri-dural bleeding); organ or nerve damage (i.e.: Any type of peripheral nerve, nerve root, or spinal cord injury) with subsequent damage to sensory, motor, and/or autonomic systems, resulting in permanent pain, numbness, and/or weakness of one or several areas of the body; allergic reactions; (i.e.:  anaphylactic reaction); and/or death. Furthermore, the patient was informed of those risks and complications associated with the medications. These include, but are not limited to: allergic reactions (i.e.: anaphylactic or anaphylactoid  reaction(s)); adrenal axis suppression; blood sugar elevation that in diabetics may result in ketoacidosis or comma; water retention that in patients with history of congestive heart failure may result in shortness of breath, pulmonary edema, and decompensation with resultant heart failure; weight gain; swelling or edema; medication-induced neural toxicity; particulate matter embolism and blood vessel occlusion with resultant organ, and/or nervous system infarction; and/or aseptic necrosis of one or more joints. Finally, the patient was informed that Medicine is not an exact science; therefore, there is also the possibility of unforeseen or unpredictable risks and/or possible complications that may result in a catastrophic outcome. The patient indicated having understood very clearly. We have given the patient no guarantees and we have made no promises. Enough time was given to the patient to ask questions, all of which were answered to the patient's satisfaction. Ms. Gibeault has indicated that she wanted to continue with the procedure. Attestation: I, the ordering provider, attest that I have discussed with the patient the benefits, risks, side-effects, alternatives, likelihood of achieving goals, and potential problems during recovery for the procedure that I have provided informed consent. Date  Time: 01/10/2018  9:42 AM  Pre-Procedure Preparation:  Monitoring: As per clinic protocol. Respiration, ETCO2, SpO2, BP, heart rate and rhythm monitor placed and checked for adequate function Safety Precautions: Patient was assessed for positional comfort and pressure points before starting the procedure. Time-out: I initiated and conducted the "Time-out" before starting the  procedure, as per protocol. The patient was asked to participate by confirming the accuracy of the "Time Out" information. Verification of the correct person, site, and procedure were performed and confirmed by me, the nursing staff, and the patient. "Time-out" conducted as per Joint Commission's Universal Protocol (UP.01.01.01). Time: 1012  Description of Procedure:       Position: Prone with head of the table was raised to facilitate breathing. Target Area: The interlaminar space, initially targeting the lower laminar border of the superior vertebral body. Approach: Paramedial approach. Area Prepped: Entire Posterior Lumbar Region Prepping solution: ChloraPrep (2% chlorhexidine gluconate and 70% isopropyl alcohol) Safety Precautions: Aspiration looking for blood return was conducted prior to all injections. At no point did we inject any substances, as a needle was being advanced. No attempts were made at seeking any paresthesias. Safe injection practices and needle disposal techniques used. Medications properly checked for expiration dates. SDV (single dose vial) medications used. Description of the Procedure: Protocol guidelines were followed. The procedure needle was introduced through the skin, ipsilateral to the reported pain, and advanced to the target area. Bone was contacted and the needle walked caudad, until the lamina was cleared. The epidural space was identified using "loss-of-resistance technique" with 2-3 ml of PF-NaCl (0.9% NSS), in a 5cc LOR glass syringe. Vitals:   01/10/18 1021 01/10/18 1030 01/10/18 1040 01/10/18 1050  BP: (!) 195/104 (!) 178/92 (!) 173/87 (!) 174/82  Pulse:      Resp: 11 16 14 16   Temp:      TempSrc:      SpO2: 100% 100% 99% 99%  Weight:      Height:        Start Time: 1013 hrs. End Time: 1019 hrs. Materials:  Needle(s) Type: Epidural needle Gauge: 17G Length: 3.5-in Medication(s): Please see orders for medications and dosing details. 8 CC solution  made of 5 cc of preservative-free saline, 2 cc of 0.2% ropivacaine, 1 cc of Decadron 10 mg/cc Imaging Guidance (Spinal):  Type of Imaging Technique: Fluoroscopy Guidance (Spinal) Indication(s): Assistance in  needle guidance and placement for procedures requiring needle placement in or near specific anatomical locations not easily accessible without such assistance. Exposure Time: Please see nurses notes. Contrast: Before injecting any contrast, we confirmed that the patient did not have an allergy to iodine, shellfish, or radiological contrast. Once satisfactory needle placement was completed at the desired level, radiological contrast was injected. Contrast injected under live fluoroscopy. No contrast complications. See chart for type and volume of contrast used. Fluoroscopic Guidance: I was personally present during the use of fluoroscopy. "Tunnel Vision Technique" used to obtain the best possible view of the target area. Parallax error corrected before commencing the procedure. "Direction-depth-direction" technique used to introduce the needle under continuous pulsed fluoroscopy. Once target was reached, antero-posterior, oblique, and lateral fluoroscopic projection used confirm needle placement in all planes. Images permanently stored in EMR. Interpretation: I personally interpreted the imaging intraoperatively. Adequate needle placement confirmed in multiple planes. Appropriate spread of contrast into desired area was observed. No evidence of afferent or efferent intravascular uptake. No intrathecal or subarachnoid spread observed. Permanent images saved into the patient's record.  Antibiotic Prophylaxis:   Anti-infectives (From admission, onward)   None     Indication(s): None identified  Post-operative Assessment:  Post-procedure Vital Signs:  Pulse Rate: 60 Temp: 97.7 F (36.5 C) Resp: 16 BP: (!) 174/82 SpO2: 99 %  EBL: None  Complications: No immediate post-treatment  complications observed by team, or reported by patient.  Note: The patient tolerated the entire procedure well. A repeat set of vitals were taken after the procedure and the patient was kept under observation following institutional policy, for this type of procedure. Post-procedural neurological assessment was performed, showing return to baseline, prior to discharge. The patient was provided with post-procedure discharge instructions, including a section on how to identify potential problems. Should any problems arise concerning this procedure, the patient was given instructions to immediately contact us, at any time, without hesitation. In any case, we plan to contact the patient by telephone for a follow-up status report regarding this interventional procedure.  Comments:  No additional relevant information. 5 out of 5 strength bilateral lower extremity: Plantar flexion, dorsiflexion, knee flexion, knee extension.  Plan of Care   Imaging Orders     DG C-Arm 1-60 Min-No Report Procedure Orders    No procedure(s) ordered today    Medications ordered for procedure: Meds ordered this encounter  Medications  . lactated ringers infusion 1,000 mL  . fentaNYL (SUBLIMAZE) injection 25-100 mcg    Make sure Narcan is available in the pyxis when using this medication. In the event of respiratory depression (RR< 8/min): Titrate NARCAN (naloxone) in increments of 0.1 to 0.2 mg IV at 2-3 minute intervals, until desired degree of reversal.  . iopamidol (ISOVUE-M) 41 % intrathecal injection 10 mL  . ropivacaine (PF) 2 mg/mL (0.2%) (NAROPIN) injection 2 mL  . sodium chloride flush (NS) 0.9 % injection 2 mL  . lidocaine (PF) (XYLOCAINE) 1 % injection 4.5 mL  . dexamethasone (DECADRON) injection 10 mg   Medications administered: We administered lactated ringers, fentaNYL, iopamidol, ropivacaine (PF) 2 mg/mL (0.2%), sodium chloride flush, lidocaine (PF), and dexamethasone.  See the medical record for  exact dosing, route, and time of administration.  New Prescriptions   No medications on file   Disposition: Discharge home  Discharge Date & Time: 01/10/2018; 1051 hrs.   Physician-requested Follow-up: Return in about 3 weeks (around 01/31/2018) for Post Procedure Evaluation.  Future Appointments  Date Time Provider Department  Center  01/30/2018 12:00 PM Gillis Santa, MD [facility] None   Primary Care Physician: Rosita Fire, MD Location: [facility] Note by: Gillis Santa, MD Date: 01/10/2018; Time: 11:35 AM  Disclaimer:  Medicine is not an exact science. The only guarantee in medicine is that nothing is guaranteed. It is important to note that the decision to proceed with this intervention was based on the information collected from the patient. The Data and conclusions were drawn from the patient's questionnaire, the interview, and the physical examination. Because the information was provided in large part by the patient, it cannot be guaranteed that it has not been purposely or unconsciously manipulated. Every effort has been made to obtain as much relevant data as possible for this evaluation. It is important to note that the conclusions that lead to this procedure are derived in large part from the available data. Always take into account that the treatment will also be dependent on availability of resources and existing treatment guidelines, considered by other Pain Management Practitioners as being common knowledge and practice, at the time of the intervention. For Medico-Legal purposes, it is also important to point out that variation in procedural techniques and pharmacological choices are the acceptable norm. The indications, contraindications, technique, and results of the above procedure should only be interpreted and judged by a Board-Certified Interventional Pain Specialist with extensive familiarity and expertise in the same exact procedure and  technique.

## 2018-01-11 ENCOUNTER — Encounter: Payer: Self-pay | Admitting: Gastroenterology

## 2018-01-11 ENCOUNTER — Telehealth: Payer: Self-pay | Admitting: *Deleted

## 2018-01-11 NOTE — Telephone Encounter (Signed)
Spoke with patient re; procedure on yesterday, denies any questions or concerns.  

## 2018-01-30 ENCOUNTER — Ambulatory Visit: Payer: Medicare HMO | Admitting: Student in an Organized Health Care Education/Training Program

## 2018-02-06 ENCOUNTER — Ambulatory Visit: Payer: Medicare HMO | Admitting: Student in an Organized Health Care Education/Training Program

## 2018-03-01 ENCOUNTER — Other Ambulatory Visit: Payer: Self-pay

## 2018-03-01 ENCOUNTER — Encounter: Payer: Self-pay | Admitting: Student in an Organized Health Care Education/Training Program

## 2018-03-01 ENCOUNTER — Ambulatory Visit
Payer: Medicare HMO | Attending: Student in an Organized Health Care Education/Training Program | Admitting: Student in an Organized Health Care Education/Training Program

## 2018-03-01 VITALS — BP 110/72 | HR 66 | Temp 97.8°F | Resp 16 | Ht 67.0 in | Wt 178.0 lb

## 2018-03-01 DIAGNOSIS — M9983 Other biomechanical lesions of lumbar region: Secondary | ICD-10-CM | POA: Diagnosis not present

## 2018-03-01 DIAGNOSIS — M47896 Other spondylosis, lumbar region: Secondary | ICD-10-CM | POA: Insufficient documentation

## 2018-03-01 DIAGNOSIS — R040 Epistaxis: Secondary | ICD-10-CM | POA: Diagnosis not present

## 2018-03-01 DIAGNOSIS — M5116 Intervertebral disc disorders with radiculopathy, lumbar region: Secondary | ICD-10-CM | POA: Insufficient documentation

## 2018-03-01 DIAGNOSIS — Z8673 Personal history of transient ischemic attack (TIA), and cerebral infarction without residual deficits: Secondary | ICD-10-CM | POA: Diagnosis not present

## 2018-03-01 DIAGNOSIS — G5603 Carpal tunnel syndrome, bilateral upper limbs: Secondary | ICD-10-CM | POA: Diagnosis not present

## 2018-03-01 DIAGNOSIS — E785 Hyperlipidemia, unspecified: Secondary | ICD-10-CM | POA: Insufficient documentation

## 2018-03-01 DIAGNOSIS — Z7982 Long term (current) use of aspirin: Secondary | ICD-10-CM | POA: Insufficient documentation

## 2018-03-01 DIAGNOSIS — M47816 Spondylosis without myelopathy or radiculopathy, lumbar region: Secondary | ICD-10-CM | POA: Diagnosis not present

## 2018-03-01 DIAGNOSIS — M5416 Radiculopathy, lumbar region: Secondary | ICD-10-CM

## 2018-03-01 DIAGNOSIS — K219 Gastro-esophageal reflux disease without esophagitis: Secondary | ICD-10-CM | POA: Diagnosis not present

## 2018-03-01 DIAGNOSIS — J449 Chronic obstructive pulmonary disease, unspecified: Secondary | ICD-10-CM | POA: Diagnosis not present

## 2018-03-01 DIAGNOSIS — F17210 Nicotine dependence, cigarettes, uncomplicated: Secondary | ICD-10-CM | POA: Insufficient documentation

## 2018-03-01 DIAGNOSIS — M48061 Spinal stenosis, lumbar region without neurogenic claudication: Secondary | ICD-10-CM | POA: Diagnosis not present

## 2018-03-01 DIAGNOSIS — M4807 Spinal stenosis, lumbosacral region: Secondary | ICD-10-CM | POA: Diagnosis not present

## 2018-03-01 DIAGNOSIS — Z79899 Other long term (current) drug therapy: Secondary | ICD-10-CM | POA: Insufficient documentation

## 2018-03-01 DIAGNOSIS — Z791 Long term (current) use of non-steroidal anti-inflammatories (NSAID): Secondary | ICD-10-CM | POA: Insufficient documentation

## 2018-03-01 DIAGNOSIS — I2510 Atherosclerotic heart disease of native coronary artery without angina pectoris: Secondary | ICD-10-CM | POA: Insufficient documentation

## 2018-03-01 DIAGNOSIS — D696 Thrombocytopenia, unspecified: Secondary | ICD-10-CM | POA: Diagnosis not present

## 2018-03-01 DIAGNOSIS — M5136 Other intervertebral disc degeneration, lumbar region: Secondary | ICD-10-CM

## 2018-03-01 DIAGNOSIS — G894 Chronic pain syndrome: Secondary | ICD-10-CM | POA: Insufficient documentation

## 2018-03-01 DIAGNOSIS — Z888 Allergy status to other drugs, medicaments and biological substances status: Secondary | ICD-10-CM | POA: Insufficient documentation

## 2018-03-01 DIAGNOSIS — M1288 Other specific arthropathies, not elsewhere classified, other specified site: Secondary | ICD-10-CM | POA: Insufficient documentation

## 2018-03-01 DIAGNOSIS — I10 Essential (primary) hypertension: Secondary | ICD-10-CM | POA: Diagnosis not present

## 2018-03-01 NOTE — Patient Instructions (Signed)
____________________________________________________________________________________________  Preparing for your procedure (without sedation)  Instructions: . Oral Intake: Do not eat or drink anything for at least 3 hours prior to your procedure. . Transportation: Unless otherwise stated by your physician, you may drive yourself after the procedure. . Blood Pressure Medicine: Take your blood pressure medicine with a sip of water the morning of the procedure. . Blood thinners:  . Diabetics on insulin: Notify the staff so that you can be scheduled 1st case in the morning. If your diabetes requires high dose insulin, take only  of your normal insulin dose the morning of the procedure and notify the staff that you have done so. . Preventing infections: Shower with an antibacterial soap the morning of your procedure.  . Build-up your immune system: Take 1000 mg of Vitamin C with every meal (3 times a day) the day prior to your procedure. Marland Kitchen Antibiotics: Inform the staff if you have a condition or reason that requires you to take antibiotics before dental procedures. . Pregnancy: If you are pregnant, call and cancel the procedure. . Sickness: If you have a cold, fever, or any active infections, call and cancel the procedure. . Arrival: You must be in the facility at least 30 minutes prior to your scheduled procedure. . Children: Do not bring any children with you. . Dress appropriately: Bring dark clothing that you would not mind if they get stained. . Valuables: Do not bring any jewelry or valuables.  Procedure appointments are reserved for interventional treatments only. Marland Kitchen No Prescription Refills. . No medication changes will be discussed during procedure appointments. . No disability issues will be discussed.  Remember:  Regular Business hours are:  Monday to Thursday 8:00 AM to 4:00 PM  Provider's Schedule: Milinda Pointer, MD:  Procedure days: Tuesday and Thursday 7:30 AM to 4:00  PM  Gillis Santa, MD:  Procedure days: Monday and Wednesday 7:30 AM to 4:00 PM ____________________________________________________________________________________________  ____________________________________________________________________________________________  Preparing for Procedure with Sedation  Instructions: . Oral Intake: Do not eat or drink anything for at least 8 hours prior to your procedure. . Transportation: Public transportation is not allowed. Bring an adult driver. The driver must be physically present in our waiting room before any procedure can be started. Marland Kitchen Physical Assistance: Bring an adult physically capable of assisting you, in the event you need help. This adult should keep you company at home for at least 6 hours after the procedure. . Blood Pressure Medicine: Take your blood pressure medicine with a sip of water the morning of the procedure. . Blood thinners:  . Diabetics on insulin: Notify the staff so that you can be scheduled 1st case in the morning. If your diabetes requires high dose insulin, take only  of your normal insulin dose the morning of the procedure and notify the staff that you have done so. . Preventing infections: Shower with an antibacterial soap the morning of your procedure. . Build-up your immune system: Take 1000 mg of Vitamin C with every meal (3 times a day) the day prior to your procedure. Marland Kitchen Antibiotics: Inform the staff if you have a condition or reason that requires you to take antibiotics before dental procedures. . Pregnancy: If you are pregnant, call and cancel the procedure. . Sickness: If you have a cold, fever, or any active infections, call and cancel the procedure. . Arrival: You must be in the facility at least 30 minutes prior to your scheduled procedure. . Children: Do not bring children with you. Marland Kitchen  Dress appropriately: Bring dark clothing that you would not mind if they get stained. . Valuables: Do not bring any jewelry or  valuables.  Procedure appointments are reserved for interventional treatments only. Marland Kitchen No Prescription Refills. . No medication changes will be discussed during procedure appointments. . No disability issues will be discussed.  Remember:  Regular Business hours are:  Monday to Thursday 8:00 AM to 4:00 PM  Provider's Schedule: Milinda Pointer, MD:  Procedure days: Tuesday and Thursday 7:30 AM to 4:00 PM  Gillis Santa, MD:  Procedure days: Monday and Wednesday 7:30 AM to 4:00 PM ____________________________________________________________________________________________

## 2018-03-01 NOTE — Progress Notes (Signed)
Safety precautions to be maintained throughout the outpatient stay will include: orient to surroundings, keep bed in low position, maintain call bell within reach at all times, provide assistance with transfer out of bed and ambulation.  

## 2018-03-01 NOTE — Progress Notes (Signed)
Patient's Name: Sophia Briggs  MRN: [id number]  Referring Provider: Rosita Fire, MD  DOB: 11-29-1945  PCP: Rosita Fire, MD  DOS: 03/01/2018  Note by: Gillis Santa, MD  Service setting: Ambulatory outpatient  Specialty: Interventional Pain Management  Location: [facility] (AMB) Pain Management Facility    Patient type: Established   Primary Reason(s) for Visit: Encounter for post-procedure evaluation of chronic illness with mild to moderate exacerbation CC: Follow-up (procedure )  HPI  Sophia Briggs is a 72 y.o. year old, female patient, who comes today for a post-procedure evaluation. She has HLD (hyperlipidemia); THROMBOCYTOPENIA; TRANSIENT ISCHEMIC ATTACK; EPISTAXIS; CAD (coronary artery disease); Chest pain; Hyperlipidemia; Smoking history; Angina pectoris ([place]); CTS (carpal tunnel syndrome); Lumbar radiculopathy; Stenosis of lumbosacral spine; and Tobacco abuse on their problem list. Her primarily concern today is the Follow-up (procedure )  Pain Assessment: Location: Lower Back Radiating: Radiates  Onset: More than a month ago Duration: Chronic pain Quality: Pins and needles, Aching, Numbness Severity: 0-No pain/10 (subjective, self-reported pain score)  Note: Reported level is compatible with observation.                         When using our objective Pain Scale, levels between 6 and 10/10 are said to belong in an emergency room, as it progressively worsens from a 6/10, described as severely limiting, requiring emergency care not usually available at an outpatient pain management facility. At a 6/10 level, communication becomes difficult and requires great effort. Assistance to reach the emergency department may be required. Facial flushing and profuse sweating along with potentially dangerous increases in heart rate and blood pressure will be evident. Effect on ADL: With walking, getting off the car, standing  Timing: Intermittent Modifying factors: Medications BP: 110/72  HR:  66  Sophia Briggs comes in today for post-procedure evaluation after the treatment done on 01/11/2018.  Further details on both, my assessment(s), as well as the proposed treatment plan, please see below.  Post-Procedure Assessment  01/10/2018 Procedure: L5-S1 ESI (midline) Pre-procedure pain score:  7/10 Post-procedure pain score: 0/10         Influential Factors: BMI: 27.88 kg/m Intra-procedural challenges: None observed.         Assessment challenges: None detected.              Reported side-effects: None.        Post-procedural adverse reactions or complications: None reported         Sedation: Please see nurses note. When no sedatives are used, the analgesic levels obtained are directly associated to the effectiveness of the local anesthetics. However, when sedation is provided, the level of analgesia obtained during the initial 1 hour following the intervention, is believed to be the result of a combination of factors. These factors may include, but are not limited to: 1. The effectiveness of the local anesthetics used. 2. The effects of the analgesic(s) and/or anxiolytic(s) used. 3. The degree of discomfort experienced by the patient at the time of the procedure. 4. The patients ability and reliability in recalling and recording the events. 5. The presence and influence of possible secondary gains and/or psychosocial factors. Reported result: Relief experienced during the 1st hour after the procedure: 100 % (Ultra-Short Term Relief)            Interpretative annotation: Clinically appropriate result. Analgesia during this period is likely to be Local Anesthetic and/or IV Sedative (Analgesic/Anxiolytic) related.  Effects of local anesthetic: The analgesic effects attained during this period are directly associated to the localized infiltration of local anesthetics and therefore cary significant diagnostic value as to the etiological location, or anatomical origin, of the pain.  Expected duration of relief is directly dependent on the pharmacodynamics of the local anesthetic used. Long-acting (4-6 hours) anesthetics used.  Reported result: Relief during the next 4 to 6 hour after the procedure: 100 % (Short-Term Relief)            Interpretative annotation: Clinically appropriate result. Analgesia during this period is likely to be Local Anesthetic-related.          Long-term benefit: Defined as the period of time past the expected duration of local anesthetics (1 hour for short-acting and 4-6 hours for long-acting). With the possible exception of prolonged sympathetic blockade from the local anesthetics, benefits during this period are typically attributed to, or associated with, other factors such as analgesic sensory neuropraxia, antiinflammatory effects, or beneficial biochemical changes provided by agents other than the local anesthetics.  Reported result: Extended relief following procedure: 50 % (Long-Term Relief)            Interpretative annotation: Clinically appropriate result. Good relief. No permanent benefit expected. Inflammation plays a part in the etiology to the pain.          Current benefits: Defined as reported results that persistent at this point in time.   Analgesia: 50 %            Function: Somewhat improved ROM: Somewhat improved Interpretative annotation: Ongoing benefit. No permanent benefit expected. Effective diagnostic intervention.          Interpretation: Results would suggest a successful diagnostic intervention.                  Plan:  Repeat treatment or therapy and compare extent and duration of benefits.                Laboratory Chemistry  Inflammation Markers (CRP: Acute Phase) (ESR: Chronic Phase) No results found for: CRP, ESRSEDRATE, LATICACIDVEN                       Rheumatology Markers No results found for: RF, ANA, LABURIC, URICUR, LYMEIGGIGMAB, LYMEABIGMQN, HLAB27                      Renal Function Markers Lab  Results  Component Value Date   BUN 14 06/22/2017   CREATININE 0.91 16/07/9603   BCR NOT APPLICABLE 54/06/8118   GFRAA 75 06/22/2017   GFRNONAA 64 06/22/2017                              Hepatic Function Markers Lab Results  Component Value Date   AST 17 06/22/2017   ALT 11 06/22/2017   ALBUMIN 3.4 04/16/2013   ALKPHOS 91 04/16/2013   LIPASE 131 04/16/2013                        Electrolytes Lab Results  Component Value Date   NA 139 06/22/2017   K 4.1 06/22/2017   CL 108 06/22/2017   CALCIUM 9.0 06/22/2017   MG 1.8 04/16/2013                        Neuropathy Markers No results found for: VITAMINB12, FOLATE, HGBA1C,  HIV                      Bone Pathology Markers Lab Results  Component Value Date   VD25OH 49 06/22/2017                         Coagulation Parameters Lab Results  Component Value Date   INR 1.0 04/16/2013   LABPROT 13.3 04/16/2013   PLT 160 06/22/2017                        Cardiovascular Markers Lab Results  Component Value Date   CKTOTAL 154 04/16/2013   CKMB 6.4 (H) 04/16/2013   TROPONINI 1.20 (H) 04/16/2013   HGB 13.2 06/22/2017   HCT 40.0 06/22/2017                         CA Markers No results found for: CEA, CA125, LABCA2                      Note: Lab results reviewed.  Recent Diagnostic Imaging Results  DG C-Arm 1-60 Min-No Report Fluoroscopy was utilized by the requesting physician.  No radiographic  interpretation.   Complexity Note: Imaging results reviewed. Results shared with Sophia Briggs, using Layman's terms.                         Meds   Current Outpatient Medications:  .  aspirin 81 MG tablet, Take 81 mg by mouth daily., Disp: , Rfl:  .  baclofen (LIORESAL) 10 MG tablet, Take 1 tablet (10 mg total) by mouth 2 (two) times daily as needed for muscle spasms., Disp: 60 tablet, Rfl: 0 .  carvedilol (COREG) 25 MG tablet, Take 1 tablet (25 mg total) by mouth 2 (two) times daily with a meal., Disp: 180 tablet, Rfl:  3 .  cholecalciferol (VITAMIN D) 1000 UNITS tablet, Take 1,000 Units by mouth 2 (two) times daily., Disp: , Rfl:  .  gabapentin (NEURONTIN) 300 MG capsule, 300 mg qhs x 2 weeks then 300 mg BID, Disp: 60 capsule, Rfl: 2 .  ibuprofen (IBU) 800 MG tablet, Take 1 tablet (800 mg total) every 8 (eight) hours as needed by mouth., Disp: 30 tablet, Rfl: 0 .  isosorbide mononitrate (IMDUR) 30 MG 24 hr tablet, Take 1 tablet (30 mg total) by mouth daily., Disp: 90 tablet, Rfl: 3 .  meloxicam (MOBIC) 7.5 MG tablet, Take 1 tablet (7.5 mg total) by mouth daily., Disp: 30 tablet, Rfl: 2 .  pantoprazole (PROTONIX) 40 MG tablet, Take 1 tablet (40 mg total) by mouth daily., Disp: 90 tablet, Rfl: 3 .  atorvastatin (LIPITOR) 40 MG tablet, Take 40 mg by mouth daily., Disp: , Rfl:  .  nitroGLYCERIN (NITROSTAT) 0.4 MG SL tablet, Place 0.4 mg under the tongue every 5 (five) minutes as needed., Disp: , Rfl:   ROS  Constitutional: Denies any fever or chills Gastrointestinal: No reported hemesis, hematochezia, vomiting, or acute GI distress Musculoskeletal: Denies any acute onset joint swelling, redness, loss of ROM, or weakness Neurological: No reported episodes of acute onset apraxia, aphasia, dysarthria, agnosia, amnesia, paralysis, loss of coordination, or loss of consciousness  Allergies  Sophia Briggs is allergic to amlodipine; atorvastatin; and clopidogrel bisulfate.  [place]  Drug: Sophia Briggs  reports that she has current or past drug history. Drug: Marijuana.  Alcohol:  reports that she does not drink alcohol. Tobacco:  reports that she has been smoking cigarettes.  She has smoked for the past 10.00 years. She has never used smokeless tobacco. Medical:  has a past medical history of Acute MI ([place]), Allergy, Anxiety, Arthritis, Back pain, COPD (chronic obstructive pulmonary disease) ([place]), Coronary artery disease, CTS (carpal tunnel syndrome) (01/06/2012), GERD (gastroesophageal reflux disease), Hyperlipidemia,  Hypertension, Stroke ([place]), Syncope and collapse, and TIA (transient ischemic attack). Surgical: Sophia Briggs  has a past surgical history that includes Cardiac catheterization; Tubal ligation; and Parotidectomy. Family: family history includes Aneurysm in her sister; Arthritis in her father and mother; Cancer in her father; Early death in her son; Heart disease (age of onset: 41) in her brother; Hypertension in her mother. She was adopted.  Constitutional Exam  General appearance: Well nourished, well developed, and well hydrated. In no apparent acute distress Vitals:   03/01/18 1002  BP: 110/72  Pulse: 66  Resp: 16  Temp: 97.8 F (36.6 C)  TempSrc: Oral  SpO2: 100%  Weight: 178 lb (80.7 kg)  Height: '5\' 7"'$  (1.702 m)   BMI Assessment: Estimated body mass index is 27.88 kg/m as calculated from the following:   Height as of this encounter: '5\' 7"'$  (1.702 m).   Weight as of this encounter: 178 lb (80.7 kg).  BMI interpretation table: BMI level Category Range association with higher incidence of chronic pain  <18 kg/m2 Underweight   18.5-24.9 kg/m2 Ideal body weight   25-29.9 kg/m2 Overweight Increased incidence by 20%  30-34.9 kg/m2 Obese (Class I) Increased incidence by 68%  35-39.9 kg/m2 Severe obesity (Class II) Increased incidence by 136%  >40 kg/m2 Extreme obesity (Class III) Increased incidence by 254%   Patient's current BMI Ideal Body weight  Body mass index is 27.88 kg/m. Ideal body weight: 61.6 kg (135 lb 12.9 oz) Adjusted ideal body weight: 69.3 kg (152 lb 10.9 oz)   BMI Readings from Last 4 Encounters:  03/01/18 27.88 kg/m  01/10/18 26.94 kg/m  12/12/17 26.78 kg/m  11/14/17 26.63 kg/m   Wt Readings from Last 4 Encounters:  03/01/18 178 lb (80.7 kg)  01/10/18 172 lb (78 kg)  12/12/17 171 lb (77.6 kg)  11/14/17 170 lb (77.1 kg)  Psych/Mental status: Alert, oriented x 3 (person, place, & time)       Eyes: PERLA Respiratory: No evidence of acute respiratory  distress  Cervical Spine Area Exam  Skin & Axial Inspection: No masses, redness, edema, swelling, or associated skin lesions Alignment: Symmetrical Functional ROM: Unrestricted ROM      Stability: No instability detected Muscle Tone/Strength: Functionally intact. No obvious neuro-muscular anomalies detected. Sensory (Neurological): Unimpaired Palpation: No palpable anomalies              Upper Extremity (UE) Exam    Side: Right upper extremity  Side: Left upper extremity  Skin & Extremity Inspection: Skin color, temperature, and hair growth are WNL. No peripheral edema or cyanosis. No masses, redness, swelling, asymmetry, or associated skin lesions. No contractures.  Skin & Extremity Inspection: Skin color, temperature, and hair growth are WNL. No peripheral edema or cyanosis. No masses, redness, swelling, asymmetry, or associated skin lesions. No contractures.  Functional ROM: Unrestricted ROM          Functional ROM: Unrestricted ROM          Muscle Tone/Strength: Functionally intact. No obvious neuro-muscular anomalies detected.  Muscle Tone/Strength: Functionally intact. No obvious neuro-muscular anomalies detected.  Sensory (Neurological):  Unimpaired          Sensory (Neurological): Unimpaired          Palpation: No palpable anomalies              Palpation: No palpable anomalies              Provocative Test(s):  Phalen's test: deferred Tinel's test: deferred Apley's scratch test (touch opposite shoulder):  Action 1 (Across chest): deferred Action 2 (Overhead): deferred Action 3 (LB reach): deferred   Provocative Test(s):  Phalen's test: deferred Tinel's test: deferred Apley's scratch test (touch opposite shoulder):  Action 1 (Across chest): deferred Action 2 (Overhead): deferred Action 3 (LB reach): deferred    Thoracic Spine Area Exam  Skin & Axial Inspection: No masses, redness, or swelling Alignment: Symmetrical Functional ROM: Unrestricted ROM Stability: No  instability detected Muscle Tone/Strength: Functionally intact. No obvious neuro-muscular anomalies detected. Sensory (Neurological): Unimpaired Muscle strength & Tone: No palpable anomalies  Lumbar Spine Area Exam  Skin & Axial Inspection: No masses, redness, or swelling Alignment: Symmetrical Functional ROM: Unrestricted ROM       Stability: No instability detected Muscle Tone/Strength: Functionally intact. No obvious neuro-muscular anomalies detected. Sensory (Neurological): Unimpaired Palpation: No palpable anomalies       Provocative Tests: Lumbar Hyperextension/rotation test: deferred today       Lumbar quadrant test (Kemp's test): (+) bilateral for foraminal stenosis Lumbar Lateral bending test: (+) due to pain. Patrick's Maneuver: deferred today                   FABER test: deferred today       Thigh-thrust test: deferred today       S-I compression test: deferred today       S-I distraction test: deferred today        Gait & Posture Assessment  Ambulation: Unassisted Gait: Relatively normal for age and body habitus Posture: WNL   Lower Extremity Exam    Side: Right lower extremity  Side: Left lower extremity  Stability: No instability observed          Stability: No instability observed          Skin & Extremity Inspection: Skin color, temperature, and hair growth are WNL. No peripheral edema or cyanosis. No masses, redness, swelling, asymmetry, or associated skin lesions. No contractures.  Skin & Extremity Inspection: Skin color, temperature, and hair growth are WNL. No peripheral edema or cyanosis. No masses, redness, swelling, asymmetry, or associated skin lesions. No contractures.  Functional ROM: Unrestricted ROM                  Functional ROM: Unrestricted ROM                  Muscle Tone/Strength: Functionally intact. No obvious neuro-muscular anomalies detected.  Muscle Tone/Strength: Functionally intact. No obvious neuro-muscular anomalies detected.  Sensory  (Neurological): Unimpaired  Sensory (Neurological): Unimpaired  Palpation: No palpable anomalies  Palpation: No palpable anomalies   Assessment  Primary Diagnosis & Pertinent Problem List: The primary encounter diagnosis was Lumbar radiculopathy. Diagnoses of Lumbar foraminal stenosis, Lumbar facet arthropathy, Lumbar spondylosis, Lumbar degenerative disc disease, and Chronic pain syndrome were also pertinent to this visit.  Status Diagnosis  Responding Responding Responding 1. Lumbar radiculopathy   2. Lumbar foraminal stenosis   3. Lumbar facet arthropathy   4. Lumbar spondylosis   5. Lumbar degenerative disc disease   6. Chronic pain syndrome      General  Recommendations: The pain condition that the patient suffers from is best treated with a multidisciplinary approach that involves an increase in physical activity to prevent de-conditioning and worsening of the pain cycle, as well as psychological counseling (formal and/or informal) to address the co-morbid psychological affects of pain. Treatment will often involve judicious use of pain medications and interventional procedures to decrease the pain, allowing the patient to participate in the physical activity that will ultimately produce long-lasting pain reductions. The goal of the multidisciplinary approach is to return the patient to a higher level of overall function and to restore their ability to perform activities of daily living.  72 year old female with an history of axial low back pain that radiates into bilateral lower extremities to her calves secondary to lumbar degenerative disc disease, lumbar radiculopathy, lumbar spondylosis most pronounced at L4/5, L5/S1 as evidenced on patient's MRI from 2009.  Given worsening axial low back pain as well as occasional weakness of her lower extremities, repeat MRI was ordered at the last visit which shows disc herniation at L2/3 with moderate bilateral facet arthropathy and moderate to  severe spinal stenosis, L3-L4 with moderate bilateral facet arthropathy and moderate to severe spinal stenosis and at L4-L5 moderate bilateral facet arthropathy along with right greater than left lateral recess stenosis and mild bilateral foraminal stenosis. Of note, repeat urine screens were + for  THC, I notified the patient that she would not be a candidate for opioid therapy at our clinic.  Treatment plan will consist of interventional options as well as non-opioid analgesics.  Patient follows up today status post midline L5-S1 epidural steroid injection performed on 01/10/2018.  Patient is endorsing ongoing significant benefit from the lumbar epidural steroid injection.  Patient states that for the first 2 weeks she had upwards of 75% pain relief which is decreased to 50% over the last 2 weeks.  She is interesting in having this procedure repeated.  We will plan for repeating her lumbar epidural steroid injection #2.  Risks and benefits were discussed.  Patient would like to proceed.  Patient continues gabapentin 300 mg nightly as well as Mobic 7.5 mg as needed.  Plan: -Follow-up for L5-S1 midline ESI #2 with sedation under fluoroscopy -Continue all other medications (non-opioid analgesics) as prescribed  Lab-work, procedure(s), and/or referral(s): Orders Placed This Encounter  Procedures  . Lumbar Epidural Injection    Provider-requested follow-up: Return in about 6 weeks (around 04/09/2018) for Procedure. Time Note: Greater than 50% of the 25 minute(s) of face-to-face time spent with Sophia Briggs, was spent in counseling/coordination of care regarding: Sophia Briggs's primary cause of pain, the treatment plan, treatment alternatives, the risks and possible complications of proposed treatment, going over the informed consent, the results, interpretation and significance of  her recent diagnostic interventional treatment(s), realistic expectations and the goals of pain management (increased in  functionality).  Future Appointments  Date Time Provider [place]  04/17/2018 10:30 AM Carlis Stable, NP [facility]    Primary Care Physician: Rosita Fire, MD Location: [facility] Out[facility] Note by: Gillis Santa, M.D Date: 03/01/2018; Time: 11:45 AM

## 2018-04-03 ENCOUNTER — Ambulatory Visit: Payer: Medicare HMO | Admitting: Gastroenterology

## 2018-04-09 ENCOUNTER — Ambulatory Visit
Admission: RE | Admit: 2018-04-09 | Discharge: 2018-04-09 | Disposition: A | Discharge status: Home / Self Care | Payer: Medicare HMO | Source: Ambulatory Visit | Attending: Student in an Organized Health Care Education/Training Program | Admitting: Student in an Organized Health Care Education/Training Program

## 2018-04-09 ENCOUNTER — Ambulatory Visit (HOSPITAL_BASED_OUTPATIENT_CLINIC_OR_DEPARTMENT_OTHER): Payer: Medicare HMO | Admitting: Student in an Organized Health Care Education/Training Program

## 2018-04-09 ENCOUNTER — Other Ambulatory Visit: Payer: Self-pay

## 2018-04-09 ENCOUNTER — Encounter: Payer: Self-pay | Admitting: Student in an Organized Health Care Education/Training Program

## 2018-04-09 VITALS — BP 159/77 | HR 56 | Temp 98.0°F | Resp 16 | Ht 68.0 in | Wt 173.0 lb

## 2018-04-09 DIAGNOSIS — M5416 Radiculopathy, lumbar region: Secondary | ICD-10-CM

## 2018-04-09 DIAGNOSIS — Z888 Allergy status to other drugs, medicaments and biological substances status: Secondary | ICD-10-CM | POA: Diagnosis not present

## 2018-04-09 DIAGNOSIS — Z79899 Other long term (current) drug therapy: Secondary | ICD-10-CM | POA: Diagnosis not present

## 2018-04-09 MED ORDER — FENTANYL CITRATE (PF) 100 MCG/2ML IJ SOLN
25.0000 ug | INTRAMUSCULAR | Status: DC | PRN
  Administered 2018-04-09: 50 ug via INTRAVENOUS
  Filled 2018-04-09: qty 2

## 2018-04-09 MED ORDER — LACTATED RINGERS IV SOLN
1000.0000 mL | Freq: Once | INTRAVENOUS | Status: AC
  Administered 2018-04-09: 1000 mL via INTRAVENOUS

## 2018-04-09 MED ORDER — ROPIVACAINE HCL 2 MG/ML IJ SOLN
10.0000 mL | Freq: Once | INTRAMUSCULAR | Status: AC
  Administered 2018-04-09: 10 mL
  Filled 2018-04-09: qty 10

## 2018-04-09 MED ORDER — ROPIVACAINE HCL 2 MG/ML IJ SOLN
INTRAMUSCULAR | Status: AC
  Filled 2018-04-09: qty 10

## 2018-04-09 MED ORDER — LIDOCAINE HCL (PF) 1 % IJ SOLN
10.0000 mL | Freq: Once | INTRAMUSCULAR | Status: AC
  Administered 2018-04-09: 10 mL
  Filled 2018-04-09: qty 10

## 2018-04-09 MED ORDER — DEXAMETHASONE SODIUM PHOSPHATE 10 MG/ML IJ SOLN
10.0000 mg | Freq: Once | INTRAMUSCULAR | Status: AC
  Administered 2018-04-09: 10 mg
  Filled 2018-04-09: qty 1

## 2018-04-09 NOTE — Patient Instructions (Signed)

## 2018-04-09 NOTE — Progress Notes (Signed)
Safety precautions to be maintained throughout the outpatient stay will include: orient to surroundings, keep bed in low position, maintain call bell within reach at all times, provide assistance with transfer out of bed and ambulation.  

## 2018-04-09 NOTE — Progress Notes (Signed)
Patient's Name: Sophia Briggs  MRN: [id number]  Referring Provider: Rosita Fire, MD  DOB: 12/01/1945  PCP: Rosita Fire, MD  DOS: 04/09/2018  Note by: Gillis Santa, MD  Service setting: Ambulatory outpatient  Specialty: Interventional Pain Management  Patient type: Established  Location: [facility] (AMB) Pain Management Facility  Visit type: Interventional Procedure   Primary Reason for Visit: Interventional Pain Management Treatment. CC: Procedure (LESI)  Procedure:       Anesthesia, Analgesia, Anxiolysis:  Type: Therapeutic Inter-Laminar Epidural Steroid Injection #2  Region: Lumbar Level: L5-S1 Level. Laterality: Midline         Type: Moderate (Conscious) Sedation combined with Local Anesthesia Indication(s): Analgesia and Anxiety Route: Intravenous (IV) IV Access: Secured Sedation: Meaningful verbal contact was maintained at all times during the procedure  Local Anesthetic: Lidocaine 1%   Indications: 1. Lumbar radiculopathy    Pain Score: Pre-procedure: 5 /10 Post-procedure: 0-No pain/10  Pre-op Assessment:  Sophia Briggs is a 72 y.o. (year old), female patient, seen today for interventional treatment. She  has a past surgical history that includes Cardiac catheterization; Tubal ligation; and Parotidectomy. [name] has a current medication list which includes the following prescription(s): aspirin, atorvastatin, baclofen, carvedilol, cholecalciferol, gabapentin, ibuprofen, isosorbide mononitrate, meloxicam, nitroglycerin, and pantoprazole, and the following Facility-Administered Medications: fentanyl and lactated ringers. Her primarily concern today is the Procedure (LESI)  Initial Vital Signs:  Pulse Rate: (!) 56 Temp: 97.8 F (36.6 C) Resp: 18 BP: (!) 178/102 SpO2: 100 %  BMI: Estimated body mass index is 26.3 kg/m as calculated from the following:   Height as of this encounter: 5\' 8"  (1.727 m).   Weight as of this encounter: 173 lb (78.5 kg).  Risk  Assessment: Allergies: Reviewed. She is allergic to amlodipine; atorvastatin; and clopidogrel bisulfate.  Allergy Precautions: None required Coagulopathies: Reviewed. None identified.  Blood-thinner therapy: None at this time Active Infection(s): Reviewed. None identified. Sophia Briggs is afebrile  Site Confirmation: Sophia Briggs was asked to confirm the procedure and laterality before marking the site Procedure checklist: Completed Consent: Before the procedure and under the influence of no sedative(s), amnesic(s), or anxiolytics, the patient was informed of the treatment options, risks and possible complications. To fulfill our ethical and legal obligations, as recommended by the American Medical Association's Code of Ethics, I have informed the patient of my clinical impression; the nature and purpose of the treatment or procedure; the risks, benefits, and possible complications of the intervention; the alternatives, including doing nothing; the risk(s) and benefit(s) of the alternative treatment(s) or procedure(s); and the risk(s) and benefit(s) of doing nothing. The patient was provided information about the general risks and possible complications associated with the procedure. These may include, but are not limited to: failure to achieve desired goals, infection, bleeding, organ or nerve damage, allergic reactions, paralysis, and death. In addition, the patient was informed of those risks and complications associated to Spine-related procedures, such as failure to decrease pain; infection (i.e.: Meningitis, epidural or intraspinal abscess); bleeding (i.e.: epidural hematoma, subarachnoid hemorrhage, or any other type of intraspinal or peri-dural bleeding); organ or nerve damage (i.e.: Any type of peripheral nerve, nerve root, or spinal cord injury) with subsequent damage to sensory, motor, and/or autonomic systems, resulting in permanent pain, numbness, and/or weakness of one or several areas of the  body; allergic reactions; (i.e.: anaphylactic reaction); and/or death. Furthermore, the patient was informed of those risks and complications associated with the medications. These include, but are not limited to: allergic reactions (i.e.: anaphylactic  or anaphylactoid reaction(s)); adrenal axis suppression; blood sugar elevation that in diabetics may result in ketoacidosis or comma; water retention that in patients with history of congestive heart failure may result in shortness of breath, pulmonary edema, and decompensation with resultant heart failure; weight gain; swelling or edema; medication-induced neural toxicity; particulate matter embolism and blood vessel occlusion with resultant organ, and/or nervous system infarction; and/or aseptic necrosis of one or more joints. Finally, the patient was informed that Medicine is not an exact science; therefore, there is also the possibility of unforeseen or unpredictable risks and/or possible complications that may result in a catastrophic outcome. The patient indicated having understood very clearly. We have given the patient no guarantees and we have made no promises. Enough time was given to the patient to ask questions, all of which were answered to the patient's satisfaction. Sophia Briggs has indicated that she wanted to continue with the procedure. Attestation: I, the ordering provider, attest that I have discussed with the patient the benefits, risks, side-effects, alternatives, likelihood of achieving goals, and potential problems during recovery for the procedure that I have provided informed consent. Date  Time: 04/09/2018  9:49 AM  Pre-Procedure Preparation:  Monitoring: As per clinic protocol. Respiration, ETCO2, SpO2, BP, heart rate and rhythm monitor placed and checked for adequate function Safety Precautions: Patient was assessed for positional comfort and pressure points before starting the procedure. Time-out: I initiated and conducted the  "Time-out" before starting the procedure, as per protocol. The patient was asked to participate by confirming the accuracy of the "Time Out" information. Verification of the correct person, site, and procedure were performed and confirmed by me, the nursing staff, and the patient. "Time-out" conducted as per Joint Commission's Universal Protocol (UP.01.01.01). Time: 1055  Description of Procedure:       Position: Prone with head of the table was raised to facilitate breathing. Target Area: The interlaminar space, initially targeting the lower laminar border of the superior vertebral body. Approach: Paramedial approach. Area Prepped: Entire Posterior Lumbar Region Prepping solution: ChloraPrep (2% chlorhexidine gluconate and 70% isopropyl alcohol) Safety Precautions: Aspiration looking for blood return was conducted prior to all injections. At no point did we inject any substances, as a needle was being advanced. No attempts were made at seeking any paresthesias. Safe injection practices and needle disposal techniques used. Medications properly checked for expiration dates. SDV (single dose vial) medications used. Description of the Procedure: Protocol guidelines were followed. The procedure needle was introduced through the skin, ipsilateral to the reported pain, and advanced to the target area. Bone was contacted and the needle walked caudad, until the lamina was cleared. The epidural space was identified using "loss-of-resistance technique" with 2-3 ml of PF-NaCl (0.9% NSS), in a 5cc LOR glass syringe. Vitals:   04/09/18 1105 04/09/18 1115 04/09/18 1125 04/09/18 1135  BP: (!) 178/94 (!) 167/81 (!) 161/89 (!) 159/77  Pulse: (!) 56     Resp: 14 15 16 16   Temp:  98 F (36.7 C)    TempSrc:  Oral    SpO2: 97%  96% 96%  Weight:      Height:        Start Time: 1055 hrs. End Time:   hrs. Materials:  Needle(s) Type: Epidural needle Gauge: 17G Length: 3.5-in Medication(s): Please see orders for  medications and dosing details. 9 CC solution made of 6 cc of preservative-free saline, 2 cc of 0.2% ropivacaine, 1 cc of Decadron 10 mg/cc Imaging Guidance (Spinal):  Type of Imaging  Technique: Fluoroscopy Guidance (Spinal) Indication(s): Assistance in needle guidance and placement for procedures requiring needle placement in or near specific anatomical locations not easily accessible without such assistance. Exposure Time: Please see nurses notes. Contrast: Before injecting any contrast, we confirmed that the patient did not have an allergy to iodine, shellfish, or radiological contrast. Once satisfactory needle placement was completed at the desired level, radiological contrast was injected. Contrast injected under live fluoroscopy. No contrast complications. See chart for type and volume of contrast used. Fluoroscopic Guidance: I was personally present during the use of fluoroscopy. "Tunnel Vision Technique" used to obtain the best possible view of the target area. Parallax error corrected before commencing the procedure. "Direction-depth-direction" technique used to introduce the needle under continuous pulsed fluoroscopy. Once target was reached, antero-posterior, oblique, and lateral fluoroscopic projection used confirm needle placement in all planes. Images permanently stored in EMR. Interpretation: I personally interpreted the imaging intraoperatively. Adequate needle placement confirmed in multiple planes. Appropriate spread of contrast into desired area was observed. No evidence of afferent or efferent intravascular uptake. No intrathecal or subarachnoid spread observed. Permanent images saved into the patient's record.  Antibiotic Prophylaxis:   Anti-infectives (From admission, onward)   None     Indication(s): None identified  Post-operative Assessment:  Post-procedure Vital Signs:  Pulse Rate: (!) 56 Temp: 98 F (36.7 C) Resp: 16 BP: (!) 159/77 SpO2: 96 %  EBL:  None  Complications: No immediate post-treatment complications observed by team, or reported by patient.  Note: The patient tolerated the entire procedure well. A repeat set of vitals were taken after the procedure and the patient was kept under observation following institutional policy, for this type of procedure. Post-procedural neurological assessment was performed, showing return to baseline, prior to discharge. The patient was provided with post-procedure discharge instructions, including a section on how to identify potential problems. Should any problems arise concerning this procedure, the patient was given instructions to immediately contact us, at any time, without hesitation. In any case, we plan to contact the patient by telephone for a follow-up status report regarding this interventional procedure.  Comments:  No additional relevant information. 5 out of 5 strength bilateral lower extremity: Plantar flexion, dorsiflexion, knee flexion, knee extension.  Plan of Care    Imaging Orders     DG C-Arm 1-60 Min-No Report Procedure Orders    No procedure(s) ordered today    Medications ordered for procedure: Meds ordered this encounter  Medications  . lactated ringers infusion 1,000 mL  . fentaNYL (SUBLIMAZE) injection 25-100 mcg    Make sure Narcan is available in the pyxis when using this medication. In the event of respiratory depression (RR< 8/min): Titrate NARCAN (naloxone) in increments of 0.1 to 0.2 mg IV at 2-3 minute intervals, until desired degree of reversal.  . lidocaine (PF) (XYLOCAINE) 1 % injection 10 mL  . ropivacaine (PF) 2 mg/mL (0.2%) (NAROPIN) injection 10 mL  . dexamethasone (DECADRON) injection 10 mg   Medications administered: We administered lactated ringers, fentaNYL, lidocaine (PF), ropivacaine (PF) 2 mg/mL (0.2%), and dexamethasone.  See the medical record for exact dosing, route, and time of administration.  New Prescriptions   No medications on  file   Disposition: Discharge home  Discharge Date & Time: 04/09/2018; 1138 hrs.   Physician-requested Follow-up: Return in about 1 month (around 05/07/2018) for Post Procedure Evaluation.  Future Appointments  Date Time Provider [place]  04/17/2018 10:30 AM Carlis Stable, NP [facility]  05/08/2018  9:30 AM  Gillis Santa, MD [facility] None   Primary Care Physician: Rosita Fire, MD Location: [facility] Note by: Gillis Santa, MD Date: 04/09/2018; Time: 11:56 AM  Disclaimer:  Medicine is not an exact science. The only guarantee in medicine is that nothing is guaranteed. It is important to note that the decision to proceed with this intervention was based on the information collected from the patient. The Data and conclusions were drawn from the patient's questionnaire, the interview, and the physical examination. Because the information was provided in large part by the patient, it cannot be guaranteed that it has not been purposely or unconsciously manipulated. Every effort has been made to obtain as much relevant data as possible for this evaluation. It is important to note that the conclusions that lead to this procedure are derived in large part from the available data. Always take into account that the treatment will also be dependent on availability of resources and existing treatment guidelines, considered by other Pain Management Practitioners as being common knowledge and practice, at the time of the intervention. For Medico-Legal purposes, it is also important to point out that variation in procedural techniques and pharmacological choices are the acceptable norm. The indications, contraindications, technique, and results of the above procedure should only be interpreted and judged by a Board-Certified Interventional Pain Specialist with extensive familiarity and expertise in the same exact procedure and technique.

## 2018-04-10 ENCOUNTER — Telehealth: Payer: Self-pay | Admitting: *Deleted

## 2018-04-10 NOTE — Telephone Encounter (Signed)
Attempted to call for post procedure follow-up. Message left. 

## 2018-04-17 ENCOUNTER — Other Ambulatory Visit: Payer: Self-pay | Admitting: *Deleted

## 2018-04-17 ENCOUNTER — Encounter: Payer: Self-pay | Admitting: Nurse Practitioner

## 2018-04-17 ENCOUNTER — Encounter: Payer: Self-pay | Admitting: *Deleted

## 2018-04-17 ENCOUNTER — Ambulatory Visit (INDEPENDENT_AMBULATORY_CARE_PROVIDER_SITE_OTHER): Payer: Medicare HMO | Admitting: Nurse Practitioner

## 2018-04-17 DIAGNOSIS — R195 Other fecal abnormalities: Secondary | ICD-10-CM | POA: Diagnosis not present

## 2018-04-17 DIAGNOSIS — K5900 Constipation, unspecified: Secondary | ICD-10-CM

## 2018-04-17 MED ORDER — NA SULFATE-K SULFATE-MG SULF 17.5-3.13-1.6 GM/177ML PO SOLN: 1.0000 | ORAL | 0 refills | Status: DC

## 2018-04-17 NOTE — Assessment & Plan Note (Signed)
The patient has chronic constipation for decades.  She has the need to use laxatives and enemas to have a bowel movement.  She typically has 1 bowel movement a week which is hard, incomplete emptying, straining.  At this point I will start her on Linzess 145 mcg.  We will provide samples and request a progress report in 1 to 2 weeks.  Follow-up in 4 months.

## 2018-04-17 NOTE — Progress Notes (Signed)
cc'ed to pcp °

## 2018-04-17 NOTE — Assessment & Plan Note (Signed)
The patient notes previous colonoscopies and polypectomies.  She cannot remember when her last colonoscopy was or where it was.  She did have a Cologuard test which was positive and she was subsequently referred to our office for colonoscopy.  She is generally asymptomatic from a GI standpoint other than chronic constipation which is not new for her.  At this point we will proceed with colonoscopy to further evaluate.  Follow-up in 4 months.  Proceed with colonoscopy with Dr. Oneida Alar in the near future. The risks, benefits, and alternatives have been discussed in detail with the patient. They state understanding and desire to proceed.   The patient is not on any anticoagulants, anxiolytics, chronic pain medications, or antidepressants.  Conscious sedation should be adequate for her procedure.

## 2018-04-17 NOTE — Patient Instructions (Signed)
1. I am giving you samples of Linzess 145 mcg.  Take 1 pill, once a day on an empty stomach. 2. Call us in 1 to 2 weeks and let us know if the medication is working, if it is working well enough, and if you have any side effects. 3. As we discussed, you may develop diarrhea initially but this should resolve in about 5 days.  If it is intolerable it does not resolve within a week, call us and let us know. 4. We will schedule your colonoscopy for you. 5. Further recommendations will be made based on the results of your colonoscopy. 6. Return for follow-up in 4 months. 7. Call us if you have any questions or concerns.  At [facility] we value your feedback. You may receive a survey about your visit today. Please share your experience as we strive to create trusting relationships with our patients to provide genuine, compassionate, quality care.  It was great to meet you both today!  I hope you have a wonderful summer!!

## 2018-04-17 NOTE — Progress Notes (Signed)
Primary Care Physician:  Rosita Fire, MD Primary Gastroenterologist:  Dr. Oneida Alar  Chief Complaint  Patient presents with  . positive cologuard    HPI:   Sophia Briggs is a 72 y.o. female who presents on referral from primary care due to positive Cologuard test.  Reviewed information provided with the referral including Cologuard results sheet.  Test indicated positive and recommended correlation with structural exam such as through colonoscopy.  No history of colonoscopy found in our system.  Today she states she has had a colonoscopy before but she cannot remember where or when. She does have a history of polypectomy x 4 (she thinks maybe in [place]?). Denies abdominal pain. Has a bowel movement every 1-2 weeks, uses laxatives, teas, or enemas. Stools hard, straining. Denies hematochezia, melena, fever, chills, unintentional weight loss. Has had constipation her whole life, no acute changes. Chronic GERD on Protonic well managed with no breakthrough. Denies chest pain, dyspnea, dizziness, lightheadedness, syncope, near syncope. Denies any other upper or lower GI symptoms.  Past Medical History:  Diagnosis Date  . Acute MI ([place])   . Allergy   . Anxiety   . Arthritis    all over  . Back pain   . COPD (chronic obstructive pulmonary disease) ([place])   . Coronary artery disease   . CTS (carpal tunnel syndrome) 01/06/2012  . GERD (gastroesophageal reflux disease)   . Hyperlipidemia   . Hypertension   . Stroke ([place])   . Syncope and collapse   . TIA (transient ischemic attack)     Past Surgical History:  Procedure Laterality Date  . CARDIAC CATHETERIZATION     stent placement 2006 at [facility]   . PAROTIDECTOMY    . TUBAL LIGATION      Current Outpatient Medications  Medication Sig Dispense Refill  . aspirin 81 MG tablet Take 81 mg by mouth daily.    Marland Kitchen atorvastatin (LIPITOR) 40 MG tablet Take 40 mg by mouth daily.    . carvedilol (COREG) 25 MG tablet  Take 1 tablet (25 mg total) by mouth 2 (two) times daily with a meal. 180 tablet 3  . cholecalciferol (VITAMIN D) 1000 UNITS tablet Take 1,000 Units by mouth 2 (two) times daily.    Marland Kitchen gabapentin (NEURONTIN) 300 MG capsule 300 mg qhs x 2 weeks then 300 mg BID 60 capsule 2  . isosorbide mononitrate (IMDUR) 30 MG 24 hr tablet Take 1 tablet (30 mg total) by mouth daily. 90 tablet 3  . meloxicam (MOBIC) 7.5 MG tablet Take 1 tablet (7.5 mg total) by mouth daily. 30 tablet 2  . nitroGLYCERIN (NITROSTAT) 0.4 MG SL tablet Place 0.4 mg under the tongue every 5 (five) minutes as needed.    . pantoprazole (PROTONIX) 40 MG tablet Take 1 tablet (40 mg total) by mouth daily. 90 tablet 3  . baclofen (LIORESAL) 10 MG tablet Take 1 tablet (10 mg total) by mouth 2 (two) times daily as needed for muscle spasms. (Patient not taking: Reported on 04/17/2018) 60 tablet 0   No current facility-administered medications for this visit.     Allergies as of 04/17/2018 - Review Complete 04/17/2018  Allergen Reaction Noted  . Amlodipine Other (See Comments) 05/06/2013  . Atorvastatin Other (See Comments) 08/05/2015  . Clopidogrel bisulfate Other (See Comments) 08/05/2015    Family History  Adopted: Yes  Problem Relation Age of Onset  . Arthritis Mother   . Hypertension Mother   . Arthritis Father   .  Cancer Father        prostate  . Early death Son        MVA  . Heart disease Brother 46  . Aneurysm Sister   . Colon cancer Neg Hx     Social History   Socioeconomic History  . Marital status: Single    Spouse name: Not on file  . Number of children: Not on file  . Years of education: Not on file  . Highest education level: Not on file  Occupational History  . Not on file  Social Needs  . Financial resource strain: Not on file  . Food insecurity:    Worry: Not on file    Inability: Not on file  . Transportation needs:    Medical: Not on file    Non-medical: Not on file  Tobacco Use  . Smoking  status: Current Every Day Smoker    Packs/day: 0.10    Years: 10.00    Pack years: 1.00    Types: Cigarettes  . Smokeless tobacco: Never Used  . Tobacco comment: 1.5 cigarettes per day  Substance and Sexual Activity  . Alcohol use: Yes    Comment: occ  . Drug use: Yes    Frequency: 3.0 times per week    Types: Marijuana    Comment: last used 04/16/18  . Sexual activity: Not Currently  Lifestyle  . Physical activity:    Days per week: Not on file    Minutes per session: Not on file  . Stress: Not on file  Relationships  . Social connections:    Talks on phone: Not on file    Gets together: Not on file    Attends religious service: Not on file    Active member of club or organization: Not on file    Attends meetings of clubs or organizations: Not on file    Relationship status: Not on file  . Intimate partner violence:    Fear of current or ex partner: Not on file    Emotionally abused: Not on file    Physically abused: Not on file    Forced sexual activity: Not on file  Other Topics Concern  . Not on file  Social History Narrative  . Not on file    Review of Systems: Complete ROS negative except as per HPI.    Physical Exam: BP 136/82   Pulse 67   Temp (!) 97.1 F (36.2 C) (Oral)   Ht 5\' 7"  (1.702 m)   Wt 173 lb (78.5 kg)   LMP 11/14/1997   BMI 27.10 kg/m  General:   Alert and oriented. Pleasant and cooperative. Well-nourished and well-developed.  Eyes:  Without icterus, sclera clear and conjunctiva pink.  Ears:  Normal auditory acuity. Cardiovascular:  S1, S2 present without murmurs appreciated. Extremities without clubbing or edema. Respiratory:  Clear to auscultation bilaterally. No wheezes, rales, or rhonchi. No distress.  Gastrointestinal:  +BS, soft, non-tender and non-distended. No HSM noted. No guarding or rebound. No masses appreciated.  Rectal:  Deferred  Musculoskalatal:  Symmetrical without gross deformities. Skin:  Intact without significant  lesions or rashes. Neurologic:  Alert and oriented x4;  grossly normal neurologically. Psych:  Alert and cooperative. Normal mood and affect. Heme/Lymph/Immune: No excessive bruising noted.    04/17/2018 11:13 AM   Disclaimer: This note was dictated with voice recognition software. Similar sounding words can inadvertently be transcribed and may not be corrected upon review.

## 2018-04-18 ENCOUNTER — Telehealth: Payer: Self-pay | Admitting: *Deleted

## 2018-04-18 MED ORDER — PEG 3350-KCL-NA BICARB-NACL 420 G PO SOLR: 4000.0000 mL | Freq: Once | ORAL | 0 refills | Status: AC

## 2018-04-18 NOTE — Telephone Encounter (Signed)
Received fax from pharmacy that suprep was going to cost $100 after insurance. Wants something else sent in. I called patient # and spoke with daughter [name]. She is aware we are sending in a new prep and I will mail new instructions. She voiced understanding.

## 2018-05-08 ENCOUNTER — Other Ambulatory Visit: Payer: Self-pay

## 2018-05-08 ENCOUNTER — Encounter: Payer: Self-pay | Admitting: Student in an Organized Health Care Education/Training Program

## 2018-05-08 ENCOUNTER — Ambulatory Visit
Payer: Medicare HMO | Attending: Student in an Organized Health Care Education/Training Program | Admitting: Student in an Organized Health Care Education/Training Program

## 2018-05-08 VITALS — BP 137/83 | HR 66 | Temp 98.0°F | Resp 16 | Ht 67.0 in | Wt 170.0 lb

## 2018-05-08 DIAGNOSIS — I2510 Atherosclerotic heart disease of native coronary artery without angina pectoris: Secondary | ICD-10-CM | POA: Insufficient documentation

## 2018-05-08 DIAGNOSIS — Z8261 Family history of arthritis: Secondary | ICD-10-CM | POA: Insufficient documentation

## 2018-05-08 DIAGNOSIS — M5416 Radiculopathy, lumbar region: Secondary | ICD-10-CM

## 2018-05-08 DIAGNOSIS — E785 Hyperlipidemia, unspecified: Secondary | ICD-10-CM | POA: Diagnosis not present

## 2018-05-08 DIAGNOSIS — Z8673 Personal history of transient ischemic attack (TIA), and cerebral infarction without residual deficits: Secondary | ICD-10-CM | POA: Diagnosis not present

## 2018-05-08 DIAGNOSIS — M48061 Spinal stenosis, lumbar region without neurogenic claudication: Secondary | ICD-10-CM | POA: Insufficient documentation

## 2018-05-08 DIAGNOSIS — M1288 Other specific arthropathies, not elsewhere classified, other specified site: Secondary | ICD-10-CM | POA: Diagnosis not present

## 2018-05-08 DIAGNOSIS — M4807 Spinal stenosis, lumbosacral region: Secondary | ICD-10-CM | POA: Insufficient documentation

## 2018-05-08 DIAGNOSIS — M47816 Spondylosis without myelopathy or radiculopathy, lumbar region: Secondary | ICD-10-CM | POA: Diagnosis not present

## 2018-05-08 DIAGNOSIS — J449 Chronic obstructive pulmonary disease, unspecified: Secondary | ICD-10-CM | POA: Diagnosis not present

## 2018-05-08 DIAGNOSIS — F17210 Nicotine dependence, cigarettes, uncomplicated: Secondary | ICD-10-CM | POA: Diagnosis not present

## 2018-05-08 DIAGNOSIS — I252 Old myocardial infarction: Secondary | ICD-10-CM | POA: Diagnosis not present

## 2018-05-08 DIAGNOSIS — F419 Anxiety disorder, unspecified: Secondary | ICD-10-CM | POA: Diagnosis not present

## 2018-05-08 DIAGNOSIS — Z8249 Family history of ischemic heart disease and other diseases of the circulatory system: Secondary | ICD-10-CM | POA: Insufficient documentation

## 2018-05-08 DIAGNOSIS — K219 Gastro-esophageal reflux disease without esophagitis: Secondary | ICD-10-CM | POA: Insufficient documentation

## 2018-05-08 DIAGNOSIS — Z79899 Other long term (current) drug therapy: Secondary | ICD-10-CM | POA: Diagnosis not present

## 2018-05-08 DIAGNOSIS — G894 Chronic pain syndrome: Secondary | ICD-10-CM | POA: Diagnosis not present

## 2018-05-08 DIAGNOSIS — M5116 Intervertebral disc disorders with radiculopathy, lumbar region: Secondary | ICD-10-CM | POA: Diagnosis not present

## 2018-05-08 DIAGNOSIS — M9983 Other biomechanical lesions of lumbar region: Secondary | ICD-10-CM | POA: Diagnosis not present

## 2018-05-08 DIAGNOSIS — Z888 Allergy status to other drugs, medicaments and biological substances status: Secondary | ICD-10-CM | POA: Diagnosis not present

## 2018-05-08 DIAGNOSIS — Z791 Long term (current) use of non-steroidal anti-inflammatories (NSAID): Secondary | ICD-10-CM | POA: Insufficient documentation

## 2018-05-08 DIAGNOSIS — K5900 Constipation, unspecified: Secondary | ICD-10-CM | POA: Diagnosis not present

## 2018-05-08 DIAGNOSIS — I10 Essential (primary) hypertension: Secondary | ICD-10-CM | POA: Insufficient documentation

## 2018-05-08 DIAGNOSIS — M5136 Other intervertebral disc degeneration, lumbar region: Secondary | ICD-10-CM

## 2018-05-08 DIAGNOSIS — Z7982 Long term (current) use of aspirin: Secondary | ICD-10-CM | POA: Diagnosis not present

## 2018-05-08 DIAGNOSIS — M549 Dorsalgia, unspecified: Secondary | ICD-10-CM | POA: Diagnosis present

## 2018-05-08 MED ORDER — GABAPENTIN 300 MG PO CAPS
300.0000 mg | ORAL_CAPSULE | Freq: Two times a day (BID) | ORAL | 4 refills | Status: DC
Start: 2018-05-08 — End: 2019-07-02

## 2018-05-08 MED ORDER — BACLOFEN 10 MG PO TABS: 10.0000 mg | ORAL_TABLET | Freq: Two times a day (BID) | ORAL | 4 refills | Status: DC | PRN

## 2018-05-08 NOTE — Progress Notes (Signed)
Safety precautions to be maintained throughout the outpatient stay will include: orient to surroundings, keep bed in low position, maintain call bell within reach at all times, provide assistance with transfer out of bed and ambulation.  

## 2018-05-08 NOTE — Patient Instructions (Signed)
Baclofen and Gabapentin sent to your pharmacy. Schedule Epidural when needed.

## 2018-05-08 NOTE — Progress Notes (Signed)
Patient's Name: Sophia Briggs  MRN: [id number]  Referring Provider: Rosita Fire, MD  DOB: [birth year]  PCP: Rosita Fire, MD  DOS: 05/08/2018  Note by: Gillis Santa, MD  Service setting: Ambulatory outpatient  Specialty: Interventional Pain Management  Location: [facility] (AMB) Pain Management Facility    Patient type: Established   Primary Reason(s) for Visit: Encounter for post-procedure evaluation of chronic illness with mild to moderate exacerbation CC: Back Pain  HPI  [name] is a 72 y.o. year old, female patient, who comes today for a post-procedure evaluation. She has HLD (hyperlipidemia); THROMBOCYTOPENIA; TRANSIENT ISCHEMIC ATTACK; EPISTAXIS; CAD (coronary artery disease); Chest pain; Hyperlipidemia; Smoking history; Angina pectoris ([place]); CTS (carpal tunnel syndrome); Lumbar radiculopathy; Stenosis of lumbosacral spine; Tobacco abuse; Positive colorectal cancer screening using Cologuard test; and Constipation on their problem list. Her primarily concern today is the Back Pain  Pain Assessment: Location: Lower Back Radiating: denies Onset: More than a month ago Duration:  >3 months Quality:  achy (minimal) Severity: 0-No pain/10 (subjective, self-reported pain score)  Note: Reported level is compatible with observation.                         When using our objective Pain Scale, levels between 6 and 10/10 are said to belong in an emergency room, as it progressively worsens from a 6/10, described as severely limiting, requiring emergency care not usually available at an outpatient pain management facility. At a 6/10 level, communication becomes difficult and requires great effort. Assistance to reach the emergency department may be required. Facial flushing and profuse sweating along with potentially dangerous increases in heart rate and blood pressure will be evident. Effect on ADL:   Timing: Rarely Modifying factors:   BP: 137/83  HR: 66  Ms. Self comes in today for  post-procedure evaluation after the treatment done on 04/10/2018.  Further details on both, my assessment(s), as well as the proposed treatment plan, please see below.  Post-Procedure Assessment  04/09/2018 Procedure: L5-S1 ESI #2 Pre-procedure pain score:  5/10 Post-procedure pain score: 0/10         Influential Factors: BMI: 26.63 kg/m Intra-procedural challenges: None observed.         Assessment challenges: None detected.              Reported side-effects: None.        Post-procedural adverse reactions or complications: None reported         Sedation: Please see nurses note. When no sedatives are used, the analgesic levels obtained are directly associated to the effectiveness of the local anesthetics. However, when sedation is provided, the level of analgesia obtained during the initial 1 hour following the intervention, is believed to be the result of a combination of factors. These factors may include, but are not limited to: 1. The effectiveness of the local anesthetics used. 2. The effects of the analgesic(s) and/or anxiolytic(s) used. 3. The degree of discomfort experienced by the patient at the time of the procedure. 4. The patients ability and reliability in recalling and recording the events. 5. The presence and influence of possible secondary gains and/or psychosocial factors. Reported result: Relief experienced during the 1st hour after the procedure: 100 % (Ultra-Short Term Relief)            Interpretative annotation: Clinically appropriate result. Analgesia during this period is likely to be Local Anesthetic and/or IV Sedative (Analgesic/Anxiolytic) related.          Effects  of local anesthetic: The analgesic effects attained during this period are directly associated to the localized infiltration of local anesthetics and therefore cary significant diagnostic value as to the etiological location, or anatomical origin, of the pain. Expected duration of relief is directly  dependent on the pharmacodynamics of the local anesthetic used. Long-acting (4-6 hours) anesthetics used.  Reported result: Relief during the next 4 to 6 hour after the procedure: 100 % (Short-Term Relief)            Interpretative annotation: Clinically appropriate result. Analgesia during this period is likely to be Local Anesthetic-related.          Long-term benefit: Defined as the period of time past the expected duration of local anesthetics (1 hour for short-acting and 4-6 hours for long-acting). With the possible exception of prolonged sympathetic blockade from the local anesthetics, benefits during this period are typically attributed to, or associated with, other factors such as analgesic sensory neuropraxia, antiinflammatory effects, or beneficial biochemical changes provided by agents other than the local anesthetics.  Reported result: Extended relief following procedure: 100 %(ongoing) (Long-Term Relief)            Interpretative annotation: Clinically possible results. Good relief. No permanent benefit expected. Inflammation plays a part in the etiology to the pain.          Current benefits: Defined as reported results that persistent at this point in time.   Analgesia: 75-100 %            Function: [name] reports improvement in function ROM: [name] reports improvement in ROM Interpretative annotation: Complete relief. Therapeutic benefit observed. Effective therapeutic approach.          Interpretation: Results would suggest a successful diagnostic and therapeutic intervention.                  Plan:  Set up procedure as a PRN palliative treatment option for this patient.                Laboratory Chemistry  Inflammation Markers (CRP: Acute Phase) (ESR: Chronic Phase) No results found for: CRP, ESRSEDRATE, LATICACIDVEN                       Rheumatology Markers No results found for: RF, ANA, LABURIC, URICUR, LYMEIGGIGMAB, LYMEABIGMQN, HLAB27                       Renal Function Markers Lab Results  Component Value Date   BUN 14 06/22/2017   CREATININE 0.91 81/82/9937   BCR NOT APPLICABLE 16/96/7893   GFRAA 75 06/22/2017   GFRNONAA 64 06/22/2017                             Hepatic Function Markers Lab Results  Component Value Date   AST 17 06/22/2017   ALT 11 06/22/2017   ALBUMIN 3.4 04/16/2013   ALKPHOS 91 04/16/2013   LIPASE 131 04/16/2013                        Electrolytes Lab Results  Component Value Date   NA 139 06/22/2017   K 4.1 06/22/2017   CL 108 06/22/2017   CALCIUM 9.0 06/22/2017   MG 1.8 04/16/2013                        Neuropathy  Markers No results found for: VITAMINB12, FOLATE, HGBA1C, HIV                      Bone Pathology Markers Lab Results  Component Value Date   VD25OH 49 06/22/2017                         Coagulation Parameters Lab Results  Component Value Date   INR 1.0 04/16/2013   LABPROT 13.3 04/16/2013   PLT 160 06/22/2017                        Cardiovascular Markers Lab Results  Component Value Date   CKTOTAL 154 04/16/2013   CKMB 6.4 (H) 04/16/2013   TROPONINI 1.20 (H) 04/16/2013   HGB 13.2 06/22/2017   HCT 40.0 06/22/2017                         CA Markers No results found for: CEA, CA125, LABCA2                      Note: Lab results reviewed.  Recent Diagnostic Imaging Results  DG C-Arm 1-60 Min-No Report Fluoroscopy was utilized by the requesting physician.  No radiographic  interpretation.   Complexity Note: Imaging results reviewed. Results shared with Ms. Frison, using Layman's terms.                         Meds   Current Outpatient Medications:  .  aspirin 81 MG tablet, Take 81 mg by mouth daily., Disp: , Rfl:  .  atorvastatin (LIPITOR) 40 MG tablet, Take 40 mg by mouth daily., Disp: , Rfl:  .  baclofen (LIORESAL) 10 MG tablet, Take 1 tablet (10 mg total) by mouth 2 (two) times daily as needed for muscle spasms., Disp: 60 tablet, Rfl: 4 .  carvedilol  (COREG) 25 MG tablet, Take 1 tablet (25 mg total) by mouth 2 (two) times daily with a meal., Disp: 180 tablet, Rfl: 3 .  cholecalciferol (VITAMIN D) 1000 UNITS tablet, Take 1,000 Units by mouth 2 (two) times daily., Disp: , Rfl:  .  gabapentin (NEURONTIN) 300 MG capsule, Take 1 capsule (300 mg total) by mouth 2 (two) times daily., Disp: 60 capsule, Rfl: 4 .  isosorbide mononitrate (IMDUR) 30 MG 24 hr tablet, Take 1 tablet (30 mg total) by mouth daily., Disp: 90 tablet, Rfl: 3 .  meloxicam (MOBIC) 7.5 MG tablet, Take 1 tablet (7.5 mg total) by mouth daily., Disp: 30 tablet, Rfl: 2 .  Na Sulfate-K Sulfate-Mg Sulf 17.5-3.13-1.6 GM/177ML SOLN, Take 1 kit by mouth as directed., Disp: 1 Bottle, Rfl: 0 .  nitroGLYCERIN (NITROSTAT) 0.4 MG SL tablet, Place 0.4 mg under the tongue every 5 (five) minutes as needed., Disp: , Rfl:  .  pantoprazole (PROTONIX) 40 MG tablet, Take 1 tablet (40 mg total) by mouth daily., Disp: 90 tablet, Rfl: 3  ROS  Constitutional: Denies any fever or chills Gastrointestinal: No reported hemesis, hematochezia, vomiting, or acute GI distress Musculoskeletal: Denies any acute onset joint swelling, redness, loss of ROM, or weakness Neurological: No reported episodes of acute onset apraxia, aphasia, dysarthria, agnosia, amnesia, paralysis, loss of coordination, or loss of consciousness  Allergies  Ms. Heggs is allergic to amlodipine; atorvastatin; and clopidogrel bisulfate.  Tees Toh  Drug: Ms. Zipp  reports that she has current or  past drug history. Drug: Marijuana. Frequency: 3.00 times per week. Alcohol:  reports that she drinks alcohol. Tobacco:  reports that she has been smoking cigarettes.  She has a 1.00 pack-year smoking history. She has never used smokeless tobacco. Medical:  has a past medical history of Acute MI ([place]), Allergy, Anxiety, Arthritis, Back pain, COPD (chronic obstructive pulmonary disease) ([place]), Coronary artery disease, CTS (carpal tunnel syndrome)  (01/06/2012), GERD (gastroesophageal reflux disease), Hyperlipidemia, Hypertension, Stroke ([place]), Syncope and collapse, and TIA (transient ischemic attack). Surgical: Ms. Lewan  has a past surgical history that includes Cardiac catheterization; Tubal ligation; and Parotidectomy. Family: family history includes Aneurysm in her sister; Arthritis in her father and mother; Cancer in her father; Early death in her son; Heart disease (age of onset: 72) in her brother; Hypertension in her mother. She was adopted.  Constitutional Exam  General appearance: Well nourished, well developed, and well hydrated. In no apparent acute distress Vitals:   05/08/18 0955  BP: 137/83  Pulse: 66  Resp: 16  Temp: 98 F (36.7 C)  SpO2: 100%  Weight: 170 lb (77.1 kg)  Height: '5\' 7"'$  (1.702 m)   BMI Assessment: Estimated body mass index is 26.63 kg/m as calculated from the following:   Height as of this encounter: '5\' 7"'$  (1.702 m).   Weight as of this encounter: 170 lb (77.1 kg).  BMI interpretation table: BMI level Category Range association with higher incidence of chronic pain  <18 kg/m2 Underweight   18.5-24.9 kg/m2 Ideal body weight   25-29.9 kg/m2 Overweight Increased incidence by 20%  30-34.9 kg/m2 Obese (Class I) Increased incidence by 68%  35-39.9 kg/m2 Severe obesity (Class II) Increased incidence by 136%  >40 kg/m2 Extreme obesity (Class III) Increased incidence by 254%   Patient's current BMI Ideal Body weight  Body mass index is 26.63 kg/m. Ideal body weight: 61.6 kg (135 lb 12.9 oz) Adjusted ideal body weight: 67.8 kg (149 lb 7.7 oz)   BMI Readings from Last 4 Encounters:  05/08/18 26.63 kg/m  04/17/18 27.10 kg/m  04/09/18 26.30 kg/m  03/01/18 27.88 kg/m   Wt Readings from Last 4 Encounters:  05/08/18 170 lb (77.1 kg)  04/17/18 173 lb (78.5 kg)  04/09/18 173 lb (78.5 kg)  03/01/18 178 lb (80.7 kg)  Psych/Mental status: Alert, oriented x 3 (person, place, & time)       Eyes:  PERLA Respiratory: No evidence of acute respiratory distress  Cervical Spine Area Exam  Skin & Axial Inspection: No masses, redness, edema, swelling, or associated skin lesions Alignment: Symmetrical Functional ROM: Unrestricted ROM      Stability: No instability detected Muscle Tone/Strength: Functionally intact. No obvious neuro-muscular anomalies detected. Sensory (Neurological): Unimpaired Palpation: No palpable anomalies              Upper Extremity (UE) Exam    Side: Right upper extremity  Side: Left upper extremity  Skin & Extremity Inspection: Skin color, temperature, and hair growth are WNL. No peripheral edema or cyanosis. No masses, redness, swelling, asymmetry, or associated skin lesions. No contractures.  Skin & Extremity Inspection: Skin color, temperature, and hair growth are WNL. No peripheral edema or cyanosis. No masses, redness, swelling, asymmetry, or associated skin lesions. No contractures.  Functional ROM: Unrestricted ROM          Functional ROM: Unrestricted ROM          Muscle Tone/Strength: Functionally intact. No obvious neuro-muscular anomalies detected.  Muscle Tone/Strength: Functionally intact. No obvious neuro-muscular anomalies  detected.  Sensory (Neurological): Unimpaired          Sensory (Neurological): Unimpaired          Palpation: No palpable anomalies              Palpation: No palpable anomalies              Provocative Test(s):  Phalen's test: deferred Tinel's test: deferred Apley's scratch test (touch opposite shoulder):  Action 1 (Across chest): deferred Action 2 (Overhead): deferred Action 3 (LB reach): deferred   Provocative Test(s):  Phalen's test: deferred Tinel's test: deferred Apley's scratch test (touch opposite shoulder):  Action 1 (Across chest): deferred Action 2 (Overhead): deferred Action 3 (LB reach): deferred    Thoracic Spine Area Exam  Skin & Axial Inspection: No masses, redness, or swelling Alignment:  Symmetrical Functional ROM: Unrestricted ROM Stability: No instability detected Muscle Tone/Strength: Functionally intact. No obvious neuro-muscular anomalies detected. Sensory (Neurological): Unimpaired Muscle strength & Tone: No palpable anomalies  Lumbar Spine Area Exam  Skin & Axial Inspection: No masses, redness, or swelling Alignment: Symmetrical Functional ROM: Improved after treatment       Stability: No instability detected Muscle Tone/Strength: Functionally intact. No obvious neuro-muscular anomalies detected. Sensory (Neurological): Improved Palpation: No palpable anomalies       Provocative Tests: Hyperextension/rotation test: Improved after treatment       Lumbar quadrant test (Kemp's test): Improved after treatment       Lateral bending test: Improved after treatment       Patrick's Maneuver: deferred today                   FABER test: deferred today                   S-I anterior distraction/compression test: deferred today         S-I lateral compression test: deferred today         S-I Thigh-thrust test: deferred today         S-I Gaenslen's test: deferred today           Gait & Posture Assessment  Ambulation: Unassisted Gait: Relatively normal for age and body habitus Posture: WNL   Lower Extremity Exam    Side: Right lower extremity  Side: Left lower extremity  Stability: No instability observed          Stability: No instability observed          Skin & Extremity Inspection: Skin color, temperature, and hair growth are WNL. No peripheral edema or cyanosis. No masses, redness, swelling, asymmetry, or associated skin lesions. No contractures.  Skin & Extremity Inspection: Skin color, temperature, and hair growth are WNL. No peripheral edema or cyanosis. No masses, redness, swelling, asymmetry, or associated skin lesions. No contractures.  Functional ROM: Unrestricted ROM                  Functional ROM: Unrestricted ROM                  Muscle Tone/Strength:  Functionally intact. No obvious neuro-muscular anomalies detected.  Muscle Tone/Strength: Functionally intact. No obvious neuro-muscular anomalies detected.  Sensory (Neurological): Unimpaired  Sensory (Neurological): Unimpaired  Palpation: No palpable anomalies  Palpation: No palpable anomalies   Assessment  Primary Diagnosis & Pertinent Problem List: The primary encounter diagnosis was Lumbar radiculopathy. Diagnoses of Lumbar foraminal stenosis, Chronic pain syndrome, Lumbar facet arthropathy, and Lumbar degenerative disc disease were also pertinent to this visit.  Status Diagnosis  Improved Controlled Improving 1. Lumbar radiculopathy   2. Lumbar foraminal stenosis   3. Chronic pain syndrome   4. Lumbar facet arthropathy   5. Lumbar degenerative disc disease      General Recommendations: The pain condition that the patient suffers from is best treated with a multidisciplinary approach that involves an increase in physical activity to prevent de-conditioning and worsening of the pain cycle, as well as psychological counseling (formal and/or informal) to address the co-morbid psychological affects of pain. Treatment will often involve judicious use of pain medications and interventional procedures to decrease the pain, allowing the patient to participate in the physical activity that will ultimately produce long-lasting pain reductions. The goal of the multidisciplinary approach is to return the patient to a higher level of overall function and to restore their ability to perform activities of daily living.  72 year old female with a history of lumbar radiculopathy status post L5-S1 epidural steroid injection #2.  Patient follows up today after her second epidural steroid injection and notes significant benefit in regards to her bilateral leg pain and thigh pain.  Patient states that she is more comfortable is able to ambulate an extended distance without as much discomfort.  We will set up  repeat lumbar epidural steroid injection as a PRN order if and when patient has return of back and leg pain.  I will also refill the patient's gabapentin and baclofen as below.  Plan of Care  Pharmacotherapy (Medications Ordered): Meds ordered this encounter  Medications  . gabapentin (NEURONTIN) 300 MG capsule    Sig: Take 1 capsule (300 mg total) by mouth 2 (two) times daily.    Dispense:  60 capsule    Refill:  4  . baclofen (LIORESAL) 10 MG tablet    Sig: Take 1 tablet (10 mg total) by mouth 2 (two) times daily as needed for muscle spasms.    Dispense:  60 tablet    Refill:  4    Do not place this medication, or any other prescription from our practice, on "Automatic Refill". Patient may have prescription filled one day early if pharmacy is closed on scheduled refill date.   Lab-work, procedure(s), and/or referral(s): Orders Placed This Encounter  Procedures  . Lumbar Epidural Injection   Time Note: Greater than 50% of the 25 minute(s) of face-to-face time spent with Ms. Dyck, was spent in counseling/coordination of care regarding: Ms. Sedlacek's primary cause of pain, the treatment plan, treatment alternatives, medication side effects, the results, interpretation and significance of  her recent diagnostic interventional treatment(s), realistic expectations and the goals of pain management (increased in functionality).  Provider-requested follow-up: Return if symptoms worsen or fail to improve.  Future Appointments  Date Time Provider [place]  08/07/2018  9:00 AM Carlis Stable, NP [facility]    Primary Care Physician: Rosita Fire, MD Location: [facility] Note by: Gillis Santa, M.D Date: 05/08/2018; Time: 10:26 AM  Patient Instructions  Baclofen and Gabapentin sent to your pharmacy. Schedule Epidural when needed.

## 2018-06-08 ENCOUNTER — Encounter (HOSPITAL_COMMUNITY)
Admission: RE | Disposition: A | Discharge status: Home / Self Care | Payer: Self-pay | Source: Ambulatory Visit | Attending: Gastroenterology

## 2018-06-08 ENCOUNTER — Other Ambulatory Visit: Payer: Self-pay

## 2018-06-08 ENCOUNTER — Ambulatory Visit (HOSPITAL_COMMUNITY)
Admission: RE | Admit: 2018-06-08 | Discharge: 2018-06-08 | Disposition: A | Discharge status: Home / Self Care | Payer: Medicare HMO | Source: Ambulatory Visit | Attending: Gastroenterology | Admitting: Gastroenterology

## 2018-06-08 ENCOUNTER — Encounter (HOSPITAL_COMMUNITY): Payer: Self-pay | Admitting: *Deleted

## 2018-06-08 DIAGNOSIS — D120 Benign neoplasm of cecum: Secondary | ICD-10-CM | POA: Insufficient documentation

## 2018-06-08 DIAGNOSIS — Z7982 Long term (current) use of aspirin: Secondary | ICD-10-CM | POA: Diagnosis not present

## 2018-06-08 DIAGNOSIS — K219 Gastro-esophageal reflux disease without esophagitis: Secondary | ICD-10-CM | POA: Diagnosis not present

## 2018-06-08 DIAGNOSIS — M1990 Unspecified osteoarthritis, unspecified site: Secondary | ICD-10-CM | POA: Diagnosis not present

## 2018-06-08 DIAGNOSIS — D122 Benign neoplasm of ascending colon: Secondary | ICD-10-CM | POA: Diagnosis not present

## 2018-06-08 DIAGNOSIS — D123 Benign neoplasm of transverse colon: Secondary | ICD-10-CM | POA: Diagnosis not present

## 2018-06-08 DIAGNOSIS — I2510 Atherosclerotic heart disease of native coronary artery without angina pectoris: Secondary | ICD-10-CM | POA: Insufficient documentation

## 2018-06-08 DIAGNOSIS — E785 Hyperlipidemia, unspecified: Secondary | ICD-10-CM | POA: Diagnosis not present

## 2018-06-08 DIAGNOSIS — R195 Other fecal abnormalities: Secondary | ICD-10-CM | POA: Diagnosis not present

## 2018-06-08 DIAGNOSIS — I10 Essential (primary) hypertension: Secondary | ICD-10-CM | POA: Insufficient documentation

## 2018-06-08 DIAGNOSIS — F419 Anxiety disorder, unspecified: Secondary | ICD-10-CM | POA: Insufficient documentation

## 2018-06-08 DIAGNOSIS — F17210 Nicotine dependence, cigarettes, uncomplicated: Secondary | ICD-10-CM | POA: Diagnosis not present

## 2018-06-08 DIAGNOSIS — K5730 Diverticulosis of large intestine without perforation or abscess without bleeding: Secondary | ICD-10-CM | POA: Diagnosis not present

## 2018-06-08 DIAGNOSIS — Z8673 Personal history of transient ischemic attack (TIA), and cerebral infarction without residual deficits: Secondary | ICD-10-CM | POA: Insufficient documentation

## 2018-06-08 DIAGNOSIS — I252 Old myocardial infarction: Secondary | ICD-10-CM | POA: Insufficient documentation

## 2018-06-08 DIAGNOSIS — J449 Chronic obstructive pulmonary disease, unspecified: Secondary | ICD-10-CM | POA: Insufficient documentation

## 2018-06-08 DIAGNOSIS — K644 Residual hemorrhoidal skin tags: Secondary | ICD-10-CM | POA: Diagnosis not present

## 2018-06-08 DIAGNOSIS — Z8249 Family history of ischemic heart disease and other diseases of the circulatory system: Secondary | ICD-10-CM | POA: Diagnosis not present

## 2018-06-08 DIAGNOSIS — Z79899 Other long term (current) drug therapy: Secondary | ICD-10-CM | POA: Diagnosis not present

## 2018-06-08 HISTORY — PX: POLYPECTOMY: SHX5525

## 2018-06-08 HISTORY — PX: COLONOSCOPY: SHX5424

## 2018-06-08 SURGERY — COLONOSCOPY
Anesthesia: Moderate Sedation

## 2018-06-08 MED ORDER — MIDAZOLAM HCL 5 MG/5ML IJ SOLN
INTRAMUSCULAR | Status: DC | PRN
  Administered 2018-06-08 (×2): 2 mg via INTRAVENOUS
  Administered 2018-06-08: 1 mg via INTRAVENOUS

## 2018-06-08 MED ORDER — MEPERIDINE HCL 100 MG/ML IJ SOLN
INTRAMUSCULAR | Status: DC | PRN
  Administered 2018-06-08: 25 mg
  Administered 2018-06-08: 50 mg

## 2018-06-08 MED ORDER — SODIUM CHLORIDE 0.9 % IV SOLN
INTRAVENOUS | Status: DC
  Administered 2018-06-08: 11:00:00 via INTRAVENOUS

## 2018-06-08 MED ORDER — MIDAZOLAM HCL 5 MG/5ML IJ SOLN
INTRAMUSCULAR | Status: AC
  Filled 2018-06-08: qty 5

## 2018-06-08 MED ORDER — MEPERIDINE HCL 100 MG/ML IJ SOLN
INTRAMUSCULAR | Status: AC
  Filled 2018-06-08: qty 1

## 2018-06-08 NOTE — H&P (Signed)
Primary Care Physician:  Rosita Fire, MD Primary Gastroenterologist:  Dr. Oneida Alar  Pre-Procedure History & Physical: HPI:  Sophia Briggs is a 72 y.o. female here for POSITIVE COLOGUARD TEST.   Past Medical History:  Diagnosis Date  . Acute MI ([place])   . Allergy   . Anxiety   . Arthritis    all over  . Back pain   . COPD (chronic obstructive pulmonary disease) ([place])   . Coronary artery disease   . CTS (carpal tunnel syndrome) 01/06/2012  . GERD (gastroesophageal reflux disease)   . Hyperlipidemia   . Hypertension   . Stroke ([place])   . Syncope and collapse   . TIA (transient ischemic attack)     Past Surgical History:  Procedure Laterality Date  . CARDIAC CATHETERIZATION     stent placement 2006 at [facility]   . PAROTIDECTOMY    . TUBAL LIGATION      Prior to Admission medications   Medication Sig Start Date End Date Taking? Authorizing Provider  aspirin 81 MG tablet Take 162 mg by mouth at bedtime.  09/17/08  Yes [provider]  atorvastatin (LIPITOR) 40 MG tablet Take 40 mg by mouth at bedtime.    Yes [provider]  carvedilol (COREG) 25 MG tablet Take 1 tablet (25 mg total) by mouth 2 (two) times daily with a meal. 07/07/17  Yes Raylene Everts, MD  cholecalciferol (VITAMIN D) 1000 UNITS tablet Take 1,000 Units by mouth 2 (two) times daily.   Yes [provider]  gabapentin (NEURONTIN) 300 MG capsule Take 1 capsule (300 mg total) by mouth 2 (two) times daily. Patient taking differently: Take 300 mg by mouth at bedtime.  05/08/18  Yes Gillis Santa, MD  isosorbide mononitrate (IMDUR) 30 MG 24 hr tablet Take 1 tablet (30 mg total) by mouth daily. Patient taking differently: Take 30 mg by mouth at bedtime.  07/07/17  Yes Raylene Everts, MD  meloxicam (MOBIC) 7.5 MG tablet Take 1 tablet (7.5 mg total) by mouth daily. Patient taking differently: Take 7.5 mg by mouth at bedtime.  12/12/17 12/12/18 Yes Lateef, Carlus Pavlov, MD  Na  Sulfate-K Sulfate-Mg Sulf 17.5-3.13-1.6 GM/177ML SOLN Take 1 kit by mouth as directed. 04/17/18  Yes Verlyn Lambert L, MD  pantoprazole (PROTONIX) 40 MG tablet Take 1 tablet (40 mg total) by mouth daily. Patient taking differently: Take 40 mg by mouth at bedtime.  07/07/17  Yes Raylene Everts, MD  baclofen (LIORESAL) 10 MG tablet Take 1 tablet (10 mg total) by mouth 2 (two) times daily as needed for muscle spasms. Patient not taking: Reported on 06/06/2018 05/08/18   Gillis Santa, MD  ibuprofen (ADVIL,MOTRIN) 800 MG tablet Take 800 mg by mouth every 6 (six) hours as needed for headache or moderate pain.    [provider]    Allergies as of 04/17/2018 - Review Complete 04/17/2018  Allergen Reaction Noted  . Amlodipine Other (See Comments) 05/06/2013  . Atorvastatin Other (See Comments) 08/05/2015  . Clopidogrel bisulfate Other (See Comments) 08/05/2015    Family History  Adopted: Yes  Problem Relation Age of Onset  . Arthritis Mother   . Hypertension Mother   . Arthritis Father   . Cancer Father        prostate  . Early death Son        MVA  . Heart disease Brother 46  . Aneurysm Sister   . Colon cancer Neg Hx  Social History   Socioeconomic History  . Marital status: Single    Spouse name: Not on file  . Number of children: Not on file  . Years of education: Not on file  . Highest education level: Not on file  Occupational History  . Not on file  Social Needs  . Financial resource strain: Not on file  . Food insecurity:    Worry: Not on file    Inability: Not on file  . Transportation needs:    Medical: Not on file    Non-medical: Not on file  Tobacco Use  . Smoking status: Current Every Day Smoker    Packs/day: 0.10    Years: 10.00    Pack years: 1.00    Types: Cigarettes  . Smokeless tobacco: Never Used  . Tobacco comment: 1.5 cigarettes per day  Substance and Sexual Activity  . Alcohol use: Yes    Comment: occ  . Drug use: Yes    Frequency:  3.0 times per week    Types: Marijuana    Comment: last used 04/16/18  . Sexual activity: Not Currently  Lifestyle  . Physical activity:    Days per week: Not on file    Minutes per session: Not on file  . Stress: Not on file  Relationships  . Social connections:    Talks on phone: Not on file    Gets together: Not on file    Attends religious service: Not on file    Active member of club or organization: Not on file    Attends meetings of clubs or organizations: Not on file    Relationship status: Not on file  . Intimate partner violence:    Fear of current or ex partner: Not on file    Emotionally abused: Not on file    Physically abused: Not on file    Forced sexual activity: Not on file  Other Topics Concern  . Not on file  Social History Narrative  . Not on file    Review of Systems: See HPI, otherwise negative ROS   Physical Exam: BP 132/82   Pulse 71   Temp 97.8 F (36.6 C) (Oral)   Resp 16   Ht '5\' 7"'$  (1.702 m)   Wt 77.1 kg   LMP 11/14/1997   SpO2 100%   BMI 26.62 kg/m  General:   Alert,  pleasant and cooperative in NAD Head:  Normocephalic and atraumatic. Neck:  Supple; Lungs:  Clear throughout to auscultation.    Heart:  Regular rate and rhythm. Abdomen:  Soft, nontender and nondistended. Normal bowel sounds, without guarding, and without rebound.   Neurologic:  Alert and  oriented x4;  grossly normal neurologically.  Impression/Plan:     POS COLOGUARD TEST.   PLAN: 1. TCS TODAY.  DISCUSSED PROCEDURE, BENEFITS, & RISKS: < 1% chance of medication reaction, bleeding, perforation, or rupture of spleen/liver.

## 2018-06-08 NOTE — Op Note (Signed)
[facility] Patient Name: Sophia Briggs Procedure Date: 06/08/2018 12:30 PM MRN: [id number] Date of Birth: 29-Mar-1946 Attending MD: Barney Drain MD, MD CSN: [id number] Age: 72 Admit Type: Outpatient Procedure:                Colonoscopy WITH COLD FORCEPS/SNARE & SNARE CAUTERY                            POLYPECTOMY Indications:              Positive Cologuard test Providers:                Barney Drain MD, MD, Janeece Riggers, RN, Nelma Rothman,                            Technician Referring MD:             Rosita Fire MD, MD Medicines:                Meperidine 75 mg IV, Midazolam 5 mg IV Complications:            No immediate complications. Estimated Blood Loss:     Estimated blood loss was minimal. Procedure:                Pre-Anesthesia Assessment:                           - Prior to the procedure, a History and Physical                            was performed, and patient medications and                            allergies were reviewed. The patient's tolerance of                            previous anesthesia was also reviewed. The risks                            and benefits of the procedure and the sedation                            options and risks were discussed with the patient.                            All questions were answered, and informed consent                            was obtained. Prior Anticoagulants: The patient has                            taken aspirin, last dose was 1 day prior to                            procedure. ASA Grade Assessment: II - A patient  with mild systemic disease. After reviewing the                            risks and benefits, the patient was deemed in                            satisfactory condition to undergo the procedure.                            After obtaining informed consent, the colonoscope                            was passed under direct vision. Throughout the      procedure, the patient's blood pressure, pulse, and                            oxygen saturations were monitored continuously. The                            CF-HQ190L ([id number]) scope was introduced through                            the anus and advanced to the the cecum, identified                            by appendiceal orifice and ileocecal valve. The                            colonoscopy was somewhat difficult due to a                            tortuous colon. Successful completion of the                            procedure was aided by increasing the dose of                            sedation medication, straightening and shortening                            the scope to obtain bowel loop reduction and                            COLOWRAP. The patient tolerated the procedure                            fairly well. The quality of the bowel preparation                            was good. The ileocecal valve, appendiceal orifice,                            and rectum were photographed. Scope In: 12:46:29 PM Scope Out: 1:59:41 PM  Scope Withdrawal Time: 1 hour 7 minutes 40 seconds  Total Procedure Duration: 1 hour 13 minutes 12 seconds  Findings:      FIFTEEN sessile polyps were found in the splenic flexure, hepatic       flexure, ascending colon and cecum. The polyps were 4 to 8 mm in size.       These polyps were removed with a cold snare. Resection and retrieval       were complete.      TWELVE sessile polyps were found in the hepatic flexure, ascending colon       and cecum. The polyps were 8 to 18 mm in size. These polyps were removed       with a hot snare. Resection and retrieval were complete.      Four sessile polyps were found in the splenic flexure and transverse       colon. The polyps were 2 to 3 mm in size. These polyps were removed with       a cold biopsy forceps. Resection and retrieval were complete.      Multiple small and large-mouthed diverticula were  found in the       recto-sigmoid colon and sigmoid colon.      External hemorrhoids were found. The hemorrhoids were moderate. Impression:               - FIFTEEN 4 to 8 mm polyps at the splenic flexure,                            at the hepatic flexure, in the ascending colon and                            in the cecum, removed with a cold snare. Resected                            and retrieved.                           - TWELVE 8 to 18 mm polyps at the hepatic flexure,                            in the ascending colon and in the cecum, removed                            with a hot snare. Resected and retrieved.                           - Four 2 to 3 mm polyps at the splenic flexure and                            in the transverse colon, removed with a cold biopsy                            forceps. Resected and retrieved.                           - Diverticulosis in the recto-sigmoid colon and in  the sigmoid colon.                           - External hemorrhoids. Moderate Sedation:      Moderate (conscious) sedation was administered by the endoscopy nurse       and supervised by the endoscopist. The following parameters were       monitored: oxygen saturation, heart rate, blood pressure, and response       to care. Total physician intraservice time was 85 minutes. Recommendation:           - Patient has a contact number available for                            emergencies. The signs and symptoms of potential                            delayed complications were discussed with the                            patient. Return to normal activities tomorrow.                            Written discharge instructions were provided to the                            patient.                           - High fiber diet.                           - Continue present medications.                           - Await pathology results.                           - Repeat  colonoscopy 1-3 YEARS for surveillance. Procedure Code(s):        --- Professional ---                           [phone], Colonoscopy, flexible; with removal of                            tumor(s), polyp(s), or other lesion(s) by snare                            technique                           [id number], 59, Colonoscopy, flexible; with biopsy,                            single or multiple                           G0500, Moderate sedation services provided by the  same physician or other qualified health care                            professional performing a gastrointestinal                            endoscopic service that sedation supports,                            requiring the presence of an independent trained                            observer to assist in the monitoring of the                            patient's level of consciousness and physiological                            status; initial 15 minutes of intra-service time;                            patient age 43 years or older (additional time may                            be reported with [phone], as appropriate)                           [phone], Moderate sedation services provided by the                            same physician or other qualified health care                            professional performing the diagnostic or                            therapeutic service that the sedation supports,                            requiring the presence of an independent trained                            observer to assist in the monitoring of the                            patient's level of consciousness and physiological                            status; each additional 15 minutes intraservice                            time (List separately in addition to code for  primary service)                           [phone], Moderate sedation services provided by the                             same physician or other qualified health care                            professional performing the diagnostic or                            therapeutic service that the sedation supports,                            requiring the presence of an independent trained                            observer to assist in the monitoring of the                            patient's level of consciousness and physiological                            status; each additional 15 minutes intraservice                            time (List separately in addition to code for                            primary service)                           [phone], Moderate sedation services provided by the                            same physician or other qualified health care                            professional performing the diagnostic or                            therapeutic service that the sedation supports,                            requiring the presence of an independent trained                            observer to assist in the monitoring of the                            patient's level of consciousness and physiological                            status; each additional 15 minutes  intraservice                            time (List separately in addition to code for                            primary service)                           [phone], Moderate sedation services provided by the                            same physician or other qualified health care                            professional performing the diagnostic or                            therapeutic service that the sedation supports,                            requiring the presence of an independent trained                            observer to assist in the monitoring of the                            patient's level of consciousness and physiological                            status; each additional 15 minutes  intraservice                            time (List separately in addition to code for                            primary service)                           [phone], Moderate sedation services provided by the                            same physician or other qualified health care                            professional performing the diagnostic or                            therapeutic service that the sedation supports,                            requiring the presence of an independent trained                            observer to assist in the monitoring of the  patient's level of consciousness and physiological                            status; each additional 15 minutes intraservice                            time (List separately in addition to code for                            primary service) Diagnosis Code(s):        --- Professional ---                           D12.2, Benign neoplasm of ascending colon                           D12.0, Benign neoplasm of cecum                           D12.3, Benign neoplasm of transverse colon (hepatic                            flexure or splenic flexure)                           K64.4, Residual hemorrhoidal skin tags                           R19.5, Other fecal abnormalities                           K57.30, Diverticulosis of large intestine without                            perforation or abscess without bleeding CPT copyright 2017 [employer]. All rights reserved. The codes documented in this report are preliminary and upon coder review may  be revised to meet current compliance requirements. Barney Drain, MD Barney Drain MD, MD 06/08/2018 2:17:38 PM This report has been signed electronically. Number of Addenda: 0

## 2018-06-08 NOTE — Discharge Instructions (Signed)
You have small internal hemorrhoids AND MODERATE EXTERNAL HEMORRHOIDS. YOU HAVE diverticulosis IN YOUR LEFT COLON. YOU HAD 31 POLYPS REMOVED.    DRINK WATER TO KEEP YOUR URINE LIGHT YELLOW.  FOLLOW A HIGH FIBER DIET. AVOID ITEMS THAT CAUSE BLOATING. See info below.  To reduce your risk of colon cancer, YOU SHOULD STOP SMOKING.  You should CONSIDER GENETIC TESTING.  YOUR BIOPSY RESULTS WILL BE AVAILABLE IN 7 DAYS.  USE PREPARATION H FOUR TIMES  A DAY IF NEEDED TO RELIEVE RECTAL PAIN/PRESSURE/BLEEDING.  Next colonoscopy in 1-3 years. YOUR SISTERS, BROTHERS, CHILDREN, AND PARENTS NEED TO HAVE A COLONOSCOPY STARTING AT THE AGE OF 40.   Colonoscopy Care After Read the instructions outlined below and refer to this sheet in the next week. These discharge instructions provide you with general information on caring for yourself after you leave the hospital. While your treatment has been planned according to the most current medical practices available, unavoidable complications occasionally occur. If you have any problems or questions after discharge, call DR. Delonda Coley, [phone].  ACTIVITY  You may resume your regular activity, but move at a slower pace for the next 24 hours.   Take frequent rest periods for the next 24 hours.   Walking will help get rid of the air and reduce the bloated feeling in your belly (abdomen).   No driving for 24 hours (because of the medicine (anesthesia) used during the test).   You may shower.   Do not sign any important legal documents or operate any machinery for 24 hours (because of the anesthesia used during the test).    NUTRITION  Drink plenty of fluids.   You may resume your normal diet as instructed by your doctor.   Begin with a light meal and progress to your normal diet. Heavy or fried foods are harder to digest and may make you feel sick to your stomach (nauseated).   Avoid alcoholic beverages for 24 hours or as instructed.     MEDICATIONS  You may resume your normal medications.   WHAT YOU CAN EXPECT TODAY  Some feelings of bloating in the abdomen.   Passage of more gas than usual.   Spotting of blood in your stool or on the toilet paper  .  IF YOU HAD POLYPS REMOVED DURING THE COLONOSCOPY:  Eat a soft diet IF YOU HAVE NAUSEA, BLOATING, ABDOMINAL PAIN, OR VOMITING.    FINDING OUT THE RESULTS OF YOUR TEST Not all test results are available during your visit. DR. Oneida Alar WILL CALL YOU WITHIN 14 DAYS OF YOUR PROCEDUE WITH YOUR RESULTS. Do not assume everything is normal if you have not heard from DR. Lennon Boutwell, CALL HER OFFICE AT [phone].  SEEK IMMEDIATE MEDICAL ATTENTION AND CALL THE OFFICE: [phone] IF:  You have more than a spotting of blood in your stool.   Your belly is swollen (abdominal distention).   You are nauseated or vomiting.   You have a temperature over 101F.   You have abdominal pain or discomfort that is severe or gets worse throughout the day.  High-Fiber Diet A high-fiber diet changes your normal diet to include more whole grains, legumes, fruits, and vegetables. Changes in the diet involve replacing refined carbohydrates with unrefined foods. The calorie level of the diet is essentially unchanged. The Dietary Reference Intake (recommended amount) for adult males is 38 grams per day. For adult females, it is 25 grams per day. Pregnant and lactating women should consume 28 grams of fiber per day.  Fiber is the intact part of a plant that is not broken down during digestion. Functional fiber is fiber that has been isolated from the plant to provide a beneficial effect in the body. PURPOSE  Increase stool bulk.   Ease and regulate bowel movements.   Lower cholesterol.   REDUCE RISK OF COLON CANCER  INDICATIONS THAT YOU NEED MORE FIBER  Constipation and hemorrhoids.   Uncomplicated diverticulosis (intestine condition) and irritable bowel syndrome.   Weight  management.   As a protective measure against hardening of the arteries (atherosclerosis), diabetes, and cancer.   GUIDELINES FOR INCREASING FIBER IN THE DIET  Start adding fiber to the diet slowly. A gradual increase of about 5 more grams (2 slices of whole-wheat bread, 2 servings of most fruits or vegetables, or 1 bowl of high-fiber cereal) per day is best. Too rapid an increase in fiber may result in constipation, flatulence, and bloating.   Drink enough water and fluids to keep your urine clear or pale yellow. Water, juice, or caffeine-free drinks are recommended. Not drinking enough fluid may cause constipation.   Eat a variety of high-fiber foods rather than one type of fiber.   Try to increase your intake of fiber through using high-fiber foods rather than fiber pills or supplements that contain small amounts of fiber.   The goal is to change the types of food eaten. Do not supplement your present diet with high-fiber foods, but replace foods in your present diet.   INCLUDE A VARIETY OF FIBER SOURCES  Replace refined and processed grains with whole grains, canned fruits with fresh fruits, and incorporate other fiber sources. White rice, white breads, and most bakery goods contain little or no fiber.   Brown whole-grain rice, buckwheat oats, and many fruits and vegetables are all good sources of fiber. These include: broccoli, Brussels sprouts, cabbage, cauliflower, beets, sweet potatoes, white potatoes (skin on), carrots, tomatoes, eggplant, squash, berries, fresh fruits, and dried fruits.   Cereals appear to be the richest source of fiber. Cereal fiber is found in whole grains and bran. Bran is the fiber-rich outer coat of cereal grain, which is largely removed in refining. In whole-grain cereals, the bran remains. In breakfast cereals, the largest amount of fiber is found in those with "bran" in their names. The fiber content is sometimes indicated on the label.   You may need to  include additional fruits and vegetables each day.   In baking, for 1 cup white flour, you may use the following substitutions:   1 cup whole-wheat flour minus 2 tablespoons.   1/2 cup white flour plus 1/2 cup whole-wheat flour.   Polyps, Colon  A polyp is extra tissue that grows inside your body. Colon polyps grow in the large intestine. The large intestine, also called the colon, is part of your digestive system. It is a long, hollow tube at the end of your digestive tract where your body makes and stores stool. Most polyps are not dangerous. They are benign. This means they are not cancerous. But over time, some types of polyps can turn into cancer. Polyps that are smaller than a pea are usually not harmful. But larger polyps could someday become or may already be cancerous. To be safe, doctors remove all polyps and test them.   PREVENTION There is not one sure way to prevent polyps. You might be able to lower your risk of getting them if you:  Eat more fruits and vegetables and less fatty food.  Do not smoke.   Avoid alcohol.   Exercise every day.   Lose weight if you are overweight.   Eating more calcium and folate can also lower your risk of getting polyps. Some foods that are rich in calcium are milk, cheese, and broccoli. Some foods that are rich in folate are chickpeas, kidney beans, and spinach.    Diverticulosis Diverticulosis is a common condition that develops when small pouches (diverticula) form in the wall of the colon. The risk of diverticulosis increases with age. It happens more often in people who eat a low-fiber diet. Most individuals with diverticulosis have no symptoms. Those individuals with symptoms usually experience belly (abdominal) pain, constipation, or loose stools (diarrhea).  HOME CARE INSTRUCTIONS  Increase the amount of fiber in your diet as directed by your caregiver or dietician. This may reduce symptoms of diverticulosis.   Drink at least 6 to  8 glasses of water each day to prevent constipation.   Try not to strain when you have a bowel movement.   Avoiding nuts and seeds to prevent complications is NOT NECESSARY.   FOODS HAVING HIGH FIBER CONTENT INCLUDE:  Fruits. Apple, peach, pear, tangerine, raisins, prunes.   Vegetables. Brussels sprouts, asparagus, broccoli, cabbage, carrot, cauliflower, romaine lettuce, spinach, summer squash, tomato, winter squash, zucchini.   Starchy Vegetables. Baked beans, kidney beans, lima beans, split peas, lentils, potatoes (with skin).   Grains. Whole wheat bread, brown rice, bran flake cereal, plain oatmeal, white rice, shredded wheat, bran muffins.   SEEK IMMEDIATE MEDICAL CARE IF:  You develop increasing pain or severe bloating.   You have an oral temperature above 101F.   You develop vomiting or bowel movements that are bloody or black.   Hemorrhoids Hemorrhoids are dilated (enlarged) veins around the rectum. Sometimes clots will form in the veins. This makes them swollen and painful. These are called thrombosed hemorrhoids. Causes of hemorrhoids include:  Constipation.   Straining to have a bowel movement.   HEAVY LIFTING   HOME CARE INSTRUCTIONS  Eat a well balanced diet and drink 6 to 8 glasses of water every day to avoid constipation. You may also use a bulk laxative.   Avoid straining to have bowel movements.   Keep anal area dry and clean.   Do not use a donut shaped pillow or sit on the toilet for long periods. This increases blood pooling and pain.   Move your bowels when your body has the urge; this will require less straining and will decrease pain and pressure.

## 2018-06-12 NOTE — Progress Notes (Signed)
PT's daughter, [name] was informed.

## 2018-06-13 ENCOUNTER — Encounter (HOSPITAL_COMMUNITY): Payer: Self-pay | Admitting: Gastroenterology

## 2018-07-16 ENCOUNTER — Other Ambulatory Visit: Payer: Self-pay | Admitting: Student in an Organized Health Care Education/Training Program

## 2018-08-07 ENCOUNTER — Encounter: Payer: Self-pay | Admitting: Nurse Practitioner

## 2018-08-07 ENCOUNTER — Ambulatory Visit: Payer: Medicare HMO | Admitting: Nurse Practitioner

## 2018-08-07 VITALS — BP 159/85 | HR 64 | Temp 96.9°F | Ht 67.0 in | Wt 168.8 lb

## 2018-08-07 DIAGNOSIS — K5900 Constipation, unspecified: Secondary | ICD-10-CM | POA: Diagnosis not present

## 2018-08-07 DIAGNOSIS — D126 Benign neoplasm of colon, unspecified: Secondary | ICD-10-CM | POA: Diagnosis not present

## 2018-08-07 NOTE — Progress Notes (Signed)
Referring Provider: Rosita Fire, MD Primary Care Physician:  Rosita Fire, MD Primary GI:  Dr. Oneida Alar  Chief Complaint  Patient presents with  . Constipation    does enema approx twice a week    HPI:   Sophia Briggs is a 72 y.o. female who presents for follow-up office visit.  The patient was last seen in our office 04/17/2018 for positive Cologuard and constipation.  Primary care performed the Cologuard test and referred to GI because of a positive result.  The patient stated she had a colonoscopy previously but cannot remember where or when.  She admitted a history of polypectomy x4 (may be in [place]).  Has a bowel movement every 1 to 2 weeks, uses laxatives, teas, or enemas.  Stools are hard and require straining.  No hematochezia, melena, or other concerning lower GI symptoms.  She does have chronic GERD on Protonix which is well managed with no breakthrough.  No other GI complaints.  Recommended starting Linzess 145 mcg, schedule colonoscopy, further recommendations based on results of the colonoscopy, follow-up in 4 months.  Her colonoscopy was completed 06/08/2018 and found fifteen 48 mm polyps at the splenic flexure, hepatic flexure, ascending colon, cecum; twelve 8 to 18 mm polyps at the hepatic flexure, ascending colon, cecum; four 2 to 3 mm polyps at the splenic flexure and transverse colon; external hemorrhoids.  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  Recommended consideration of genetic testing, Preparation H as needed for hemorrhoids, repeat colonoscopy in 3 years, family precautions for colonoscopy starting at age 88 for all primary relatives.  Today she states she's doing ok overall. She developed a rash on Linzess and stopped it (is on allergy list now). Has a bowel movement every 3-4 days but only if she uses an OTC laxative or enema. Stools are hard, has straining unless she uses a laxative. Has had colonoscopy since childhood. Denies  hematochezia, melena, fever, chills, unintentional weight loss. Denies abdominal pain. Has some mild nausea when she's significantly constipated. Denies chest pain, dyspnea, dizziness, lightheadedness, syncope, near syncope. Denies any other upper or lower GI symptoms.  Past Medical History:  Diagnosis Date  . Acute MI ([place])   . Allergy   . Anxiety   . Arthritis    all over  . Back pain   . COPD (chronic obstructive pulmonary disease) ([place])   . Coronary artery disease   . CTS (carpal tunnel syndrome) 01/06/2012  . GERD (gastroesophageal reflux disease)   . Hyperlipidemia   . Hypertension   . Stroke ([place])   . Syncope and collapse   . TIA (transient ischemic attack)     Past Surgical History:  Procedure Laterality Date  . CARDIAC CATHETERIZATION     stent placement 2006 at [facility]   . COLONOSCOPY N/A 06/08/2018   Procedure: COLONOSCOPY;  Surgeon: Danie Binder, MD;  Location: [facility];  Service: Endoscopy;  Laterality: N/A;  12:15pm  . PAROTIDECTOMY    . POLYPECTOMY  06/08/2018   Procedure: POLYPECTOMY;  Surgeon: Danie Binder, MD;  Location: [facility];  Service: Endoscopy;;  cecal polyps times 6 ( 4cs, 2 hs), ascending colon polyps ( 2cs, 2hs) hepatic flexure polyps (4 cs, 5 hs), transverse colon polyps  splenic flexure polyp times 2 cs, 2 cb  . TUBAL LIGATION      Current Outpatient Medications  Medication Sig Dispense Refill  . aspirin 81 MG tablet Take 162 mg by mouth  at bedtime.     Marland Kitchen atorvastatin (LIPITOR) 40 MG tablet Take 40 mg by mouth at bedtime.     . baclofen (LIORESAL) 10 MG tablet Take 1 tablet (10 mg total) by mouth 2 (two) times daily as needed for muscle spasms. 60 tablet 4  . carvedilol (COREG) 25 MG tablet Take 1 tablet (25 mg total) by mouth 2 (two) times daily with a meal. 180 tablet 3  . cholecalciferol (VITAMIN D) 1000 UNITS tablet Take 1,000 Units by mouth daily.     Marland Kitchen gabapentin (NEURONTIN) 300 MG capsule Take 1 capsule (300 mg total)  by mouth 2 (two) times daily. (Patient taking differently: Take 300 mg by mouth as needed. ) 60 capsule 4  . isosorbide mononitrate (IMDUR) 30 MG 24 hr tablet Take 1 tablet (30 mg total) by mouth daily. (Patient taking differently: Take 30 mg by mouth at bedtime. ) 90 tablet 3  . meloxicam (MOBIC) 7.5 MG tablet Take 1 tablet (7.5 mg total) by mouth daily. (Patient taking differently: Take 7.5 mg by mouth at bedtime. ) 30 tablet 2  . pantoprazole (PROTONIX) 40 MG tablet Take 1 tablet (40 mg total) by mouth daily. (Patient taking differently: Take 40 mg by mouth at bedtime. ) 90 tablet 3   No current facility-administered medications for this visit.     Allergies as of 08/07/2018 - Review Complete 08/07/2018  Allergen Reaction Noted  . Amlodipine Other (See Comments) 05/06/2013  . Atorvastatin Other (See Comments) 08/05/2015  . Clopidogrel bisulfate Other (See Comments) 08/05/2015  . Linzess [linaclotide] Rash 08/07/2018    Family History  Adopted: Yes  Problem Relation Age of Onset  . Arthritis Mother   . Hypertension Mother   . Arthritis Father   . Cancer Father        prostate  . Early death Son        MVA  . Heart disease Brother 45  . Aneurysm Sister   . Colon cancer Neg Hx     Social History   Socioeconomic History  . Marital status: Single    Spouse name: Not on file  . Number of children: Not on file  . Years of education: Not on file  . Highest education level: Not on file  Occupational History  . Not on file  Social Needs  . Financial resource strain: Not on file  . Food insecurity:    Worry: Not on file    Inability: Not on file  . Transportation needs:    Medical: Not on file    Non-medical: Not on file  Tobacco Use  . Smoking status: Current Every Day Smoker    Packs/day: 0.10    Years: 10.00    Pack years: 1.00    Types: Cigarettes  . Smokeless tobacco: Never Used  . Tobacco comment: 1.5 cigarettes per day  Substance and Sexual Activity  . Alcohol  use: Yes    Comment: occ  . Drug use: Yes    Frequency: 3.0 times per week    Types: Marijuana    Comment: occ  . Sexual activity: Not Currently  Lifestyle  . Physical activity:    Days per week: Not on file    Minutes per session: Not on file  . Stress: Not on file  Relationships  . Social connections:    Talks on phone: Not on file    Gets together: Not on file    Attends religious service: Not on file  Active member of club or organization: Not on file    Attends meetings of clubs or organizations: Not on file    Relationship status: Not on file  Other Topics Concern  . Not on file  Social History Narrative  . Not on file    Review of Systems: Complete ROS negative except as per HPI.   Physical Exam: BP (!) 159/85   Pulse 64   Temp (!) 96.9 F (36.1 C) (Oral)   Ht 5\' 7"  (1.702 m)   Wt 168 lb 12.8 oz (76.6 kg)   LMP 11/14/1997   BMI 26.44 kg/m  General:   Alert and oriented. Pleasant and cooperative. Well-nourished and well-developed.  Eyes:  Without icterus, sclera clear and conjunctiva pink.  Ears:  Normal auditory acuity. Cardiovascular:  S1, S2 present without murmurs appreciated. Extremities without clubbing or edema. Respiratory:  Clear to auscultation bilaterally. No wheezes, rales, or rhonchi. No distress.  Gastrointestinal:  +BS, soft, non-tender and non-distended. No HSM noted. No guarding or rebound. No masses appreciated.  Rectal:  Deferred  Musculoskalatal:  Symmetrical without gross deformities. Neurologic:  Alert and oriented x4;  grossly normal neurologically. Psych:  Alert and cooperative. Normal mood and affect. Heme/Lymph/Immune: No excessive bruising noted.    08/07/2018 9:18 AM   Disclaimer: This note was dictated with voice recognition software. Similar sounding words can inadvertently be transcribed and may not be corrected upon review.

## 2018-08-07 NOTE — Assessment & Plan Note (Signed)
Colonoscopy up-to-date, completed about 2 months ago.  She had numerous polyps (about 32 and total) which were tubular adenoma.  I rediscussed recommendations including repeat colonoscopy in 3 years and discussion with her primary relatives about colonoscopy starting age 70.  She verbalized understanding.

## 2018-08-07 NOTE — Patient Instructions (Signed)
1. I am giving you Amitiza 24 mcg.  Take this twice a day, on a full stomach (after meal). 2. Call us in 1 to 2 weeks and let us know if it is helping her constipation. 3. Return for follow-up in 4 months. 4. Call us if you have any questions or concerns.  At [facility] we value your feedback. You may receive a survey about your visit today. Please share your experience as we strive to create trusting relationships with our patients to provide genuine, compassionate, quality care.  We appreciate your understanding and patience as we review any laboratory studies, imaging, and other diagnostic tests that are ordered as we care for you. Our office policy is 5 business days for review of these results, and any emergent or urgent results are addressed in a timely manner for your best interest. If you do not hear from our office in 1 week, please contact us.   We also encourage the use of MyChart, which contains your medical information for your review as well. If you are not enrolled in this feature, an access code is on this after visit summary for your convenience. Thank you for allowing [place] to be involved in your care.  It was great to see you today!  I hope you have a great Fall!!

## 2018-08-07 NOTE — Assessment & Plan Note (Signed)
Significant, chronic constipation since childhood.  She has a bowel movement about twice a week when she uses a laxative or enema.  Linzess caused a rash and has been added to her allergy list.  At this point I will start her on Amitiza 24 mcg twice daily.  We will provide samples to last 1 week and request a progress report after that time.  We can send in a prescription if it is effective or we can try another agent if it is not.  Follow-up in 4 months.

## 2018-08-07 NOTE — Progress Notes (Signed)
CC'D TO PCP °

## 2018-12-06 ENCOUNTER — Ambulatory Visit: Payer: Medicare HMO | Admitting: Nurse Practitioner

## 2018-12-06 ENCOUNTER — Encounter: Payer: Self-pay | Admitting: Nurse Practitioner

## 2018-12-06 ENCOUNTER — Telehealth: Payer: Self-pay | Admitting: Internal Medicine

## 2018-12-06 VITALS — BP 137/78 | HR 70 | Temp 96.7°F | Ht 67.0 in | Wt 170.0 lb

## 2018-12-06 DIAGNOSIS — K5900 Constipation, unspecified: Secondary | ICD-10-CM

## 2018-12-06 NOTE — Patient Instructions (Signed)
Your health issues we discussed today were:   Constipation: 1. I am giving you samples of Trulance 3 mg.  Take this once a day, with or without food 2. Call us in 1 to 2 weeks and let us know if it helps your constipation 3. Call if you have any worsening or severe symptoms.  Overall I recommend:  1. Follow-up in 2 months 2. Call if you have any questions or concerns.  At [facility] we value your feedback. You may receive a survey about your visit today. Please share your experience as we strive to create trusting relationships with our patients to provide genuine, compassionate, quality care.  We appreciate your understanding and patience as we review any laboratory studies, imaging, and other diagnostic tests that are ordered as we care for you. Our office policy is 5 business days for review of these results, and any emergent or urgent results are addressed in a timely manner for your best interest. If you do not hear from our office in 1 week, please contact us.   We also encourage the use of MyChart, which contains your medical information for your review as well. If you are not enrolled in this feature, an access code is on this after visit summary for your convenience. Thank you for allowing [place] to be involved in your care.  It was great to see you today!  I hope you have a great day!!

## 2018-12-06 NOTE — Progress Notes (Signed)
Referring Provider: Rosita Fire, MD Primary Care Physician:  Rosita Fire, MD Primary GI:  Dr. Oneida Alar  Chief Complaint  Patient presents with  . Constipation    BM twice a week, straining, no blood    HPI:   Sophia Briggs is a 73 y.o. female who presents for follow-up on chronic constipation.  The patient was last seen in our office 08/07/2018 for the same as well as recent adenomatous colon polyp.  Colonoscopy up-to-date 06/08/2018 and recommended repeat in 3 years for surveillance.  Also recommended consideration of genetic testing, Preparation H for hemorrhoids, family precautions for colonoscopy starting at age 52 for a primary relatives.  History of lifelong constipation previously started on Linzess but stopped it because she started a rash.  Bowel movement every 3 to 4 days only she uses no over-the-counter laxative with noted hard stools and straining.  No other GI symptoms.  Recommended Amitiza 24 mcg with a progress report requested and follow-up in 4 months.  She did not call a progress report as requested  Today she states she's doing about the same. Amitiza hasn't helped. Other OTC medications hasn't helped. Has a bowel movement about twice a week with straining and hard stools; usually needs an enema to go. Denies abdominal pain, N/V, hematochezia, melena. Denies chest pain, dyspnea, dizziness, lightheadedness, syncope, near syncope. Denies any other upper or lower GI symptoms.  Past Medical History:  Diagnosis Date  . Acute MI ([place])   . Allergy   . Anxiety   . Arthritis    all over  . Back pain   . COPD (chronic obstructive pulmonary disease) ([place])   . Coronary artery disease   . CTS (carpal tunnel syndrome) 01/06/2012  . GERD (gastroesophageal reflux disease)   . Hyperlipidemia   . Hypertension   . Stroke ([place])   . Syncope and collapse   . TIA (transient ischemic attack)     Past Surgical History:  Procedure Laterality Date  . CARDIAC CATHETERIZATION      stent placement 2006 at [facility]   . COLONOSCOPY N/A 06/08/2018   Procedure: COLONOSCOPY;  Surgeon: Danie Binder, MD;  Location: [facility];  Service: Endoscopy;  Laterality: N/A;  12:15pm  . PAROTIDECTOMY    . POLYPECTOMY  06/08/2018   Procedure: POLYPECTOMY;  Surgeon: Danie Binder, MD;  Location: [facility];  Service: Endoscopy;;  cecal polyps times 6 ( 4cs, 2 hs), ascending colon polyps ( 2cs, 2hs) hepatic flexure polyps (4 cs, 5 hs), transverse colon polyps  splenic flexure polyp times 2 cs, 2 cb  . TUBAL LIGATION      Current Outpatient Medications  Medication Sig Dispense Refill  . aspirin 81 MG tablet Take 162 mg by mouth at bedtime.     Marland Kitchen atorvastatin (LIPITOR) 40 MG tablet Take 40 mg by mouth at bedtime.     . baclofen (LIORESAL) 10 MG tablet Take 1 tablet (10 mg total) by mouth 2 (two) times daily as needed for muscle spasms. 60 tablet 4  . carvedilol (COREG) 25 MG tablet Take 1 tablet (25 mg total) by mouth 2 (two) times daily with a meal. 180 tablet 3  . cholecalciferol (VITAMIN D) 1000 UNITS tablet Take 1,000 Units by mouth daily.     Marland Kitchen gabapentin (NEURONTIN) 300 MG capsule Take 1 capsule (300 mg total) by mouth 2 (two) times daily. (Patient taking differently: Take 300 mg by mouth as needed. ) 60 capsule 4  .  isosorbide mononitrate (IMDUR) 30 MG 24 hr tablet Take 1 tablet (30 mg total) by mouth daily. (Patient taking differently: Take 30 mg by mouth at bedtime. ) 90 tablet 3  . meloxicam (MOBIC) 7.5 MG tablet Take 1 tablet (7.5 mg total) by mouth daily. (Patient taking differently: Take 7.5 mg by mouth at bedtime. ) 30 tablet 2  . pantoprazole (PROTONIX) 40 MG tablet Take 1 tablet (40 mg total) by mouth daily. (Patient taking differently: Take 40 mg by mouth at bedtime. ) 90 tablet 3   No current facility-administered medications for this visit.     Allergies as of 12/06/2018 - Review Complete 12/06/2018  Allergen Reaction Noted  . Amlodipine Other (See  Comments) 05/06/2013  . Atorvastatin Other (See Comments) 08/05/2015  . Clopidogrel bisulfate Other (See Comments) 08/05/2015  . Linzess [linaclotide] Rash 08/07/2018    Family History  Adopted: Yes  Problem Relation Age of Onset  . Arthritis Mother   . Hypertension Mother   . Arthritis Father   . Cancer Father        prostate  . Early death Son        MVA  . Heart disease Brother 35  . Aneurysm Sister   . Colon cancer Neg Hx     Social History   Socioeconomic History  . Marital status: Single    Spouse name: Not on file  . Number of children: Not on file  . Years of education: Not on file  . Highest education level: Not on file  Occupational History  . Not on file  Social Needs  . Financial resource strain: Not on file  . Food insecurity:    Worry: Not on file    Inability: Not on file  . Transportation needs:    Medical: Not on file    Non-medical: Not on file  Tobacco Use  . Smoking status: Current Every Day Smoker    Packs/day: 0.10    Years: 10.00    Pack years: 1.00    Types: Cigarettes  . Smokeless tobacco: Never Used  . Tobacco comment: 1.5 cigarettes per day  Substance and Sexual Activity  . Alcohol use: Yes    Comment: occ  . Drug use: Yes    Frequency: 3.0 times per week    Types: Marijuana    Comment: occ  . Sexual activity: Not Currently  Lifestyle  . Physical activity:    Days per week: Not on file    Minutes per session: Not on file  . Stress: Not on file  Relationships  . Social connections:    Talks on phone: Not on file    Gets together: Not on file    Attends religious service: Not on file    Active member of club or organization: Not on file    Attends meetings of clubs or organizations: Not on file    Relationship status: Not on file  Other Topics Concern  . Not on file  Social History Narrative  . Not on file    Review of Systems: Complete ROS negative except as per HPI.   Physical Exam: BP 137/78   Pulse 70   Temp  (!) 96.7 F (35.9 C) (Oral)   Ht 5\' 7"  (1.702 m)   Wt 170 lb (77.1 kg)   LMP 11/14/1997   BMI 26.63 kg/m  General:   Alert and oriented. Pleasant and cooperative. Well-nourished and well-developed.  Eyes:  Without icterus, sclera clear and conjunctiva pink.  Ears:  Normal auditory acuity. Cardiovascular:  S1, S2 present without murmurs appreciated. Extremities without clubbing or edema. Respiratory:  Clear to auscultation bilaterally. No wheezes, rales, or rhonchi. No distress.  Gastrointestinal:  +BS, soft, non-tender and non-distended. No HSM noted. No guarding or rebound. No masses appreciated.  Rectal:  Deferred  Musculoskalatal:  Symmetrical without gross deformities. Neurologic:  Alert and oriented x4;  grossly normal neurologically. Psych:  Alert and cooperative. Normal mood and affect. Heme/Lymph/Immune: No excessive bruising noted.    12/06/2018 9:16 AM   Disclaimer: This note was dictated with voice recognition software. Similar sounding words can inadvertently be transcribed and may not be corrected upon review.

## 2018-12-06 NOTE — Progress Notes (Signed)
CC'D TO PCP °

## 2018-12-06 NOTE — Assessment & Plan Note (Signed)
The patient has persistent, mildly worsened constipation.  Bowel movement 1-2 times a week with hard stools that require straining.  Had an allergic reaction to Linzess, failed Amitiza.  At this point I will trial her on Trulance 3 mg once a day with or without food.  We will provide samples for 1 or 2 weeks and request a progress report.  Follow-up in 2 months.

## 2018-12-13 ENCOUNTER — Telehealth: Payer: Self-pay

## 2018-12-13 NOTE — Telephone Encounter (Signed)
Noted. No further recommendations.  

## 2018-12-13 NOTE — Telephone Encounter (Signed)
Pt came by the office and said Trulance is not working. She tried Amitiza 24 mcg and failed it. She tried Linzess and thought she was allergic to it. Per Walden Field, NP I gave pt Motegrity 2 mg #14 to try one tablet daily with or without food. She will let us know how it works.

## 2018-12-17 ENCOUNTER — Other Ambulatory Visit (HOSPITAL_COMMUNITY): Payer: Self-pay | Admitting: Internal Medicine

## 2018-12-17 DIAGNOSIS — Z13820 Encounter for screening for osteoporosis: Secondary | ICD-10-CM

## 2018-12-17 DIAGNOSIS — Z780 Asymptomatic menopausal state: Secondary | ICD-10-CM

## 2018-12-31 ENCOUNTER — Other Ambulatory Visit (HOSPITAL_COMMUNITY): Payer: Medicare HMO

## 2019-02-13 ENCOUNTER — Ambulatory Visit: Payer: Medicare HMO | Admitting: Nurse Practitioner

## 2019-02-19 ENCOUNTER — Encounter (HOSPITAL_COMMUNITY): Payer: Self-pay

## 2019-02-19 ENCOUNTER — Emergency Department (HOSPITAL_COMMUNITY)
Admission: EM | Admit: 2019-02-19 | Discharge: 2019-02-19 | Disposition: A | Discharge status: Home / Self Care | Payer: Medicare HMO | Attending: Emergency Medicine | Admitting: Emergency Medicine

## 2019-02-19 ENCOUNTER — Other Ambulatory Visit: Payer: Self-pay

## 2019-02-19 DIAGNOSIS — Z8673 Personal history of transient ischemic attack (TIA), and cerebral infarction without residual deficits: Secondary | ICD-10-CM | POA: Insufficient documentation

## 2019-02-19 DIAGNOSIS — J449 Chronic obstructive pulmonary disease, unspecified: Secondary | ICD-10-CM | POA: Diagnosis not present

## 2019-02-19 DIAGNOSIS — Y929 Unspecified place or not applicable: Secondary | ICD-10-CM | POA: Insufficient documentation

## 2019-02-19 DIAGNOSIS — Z79899 Other long term (current) drug therapy: Secondary | ICD-10-CM | POA: Insufficient documentation

## 2019-02-19 DIAGNOSIS — S161XXA Strain of muscle, fascia and tendon at neck level, initial encounter: Secondary | ICD-10-CM | POA: Insufficient documentation

## 2019-02-19 DIAGNOSIS — F17210 Nicotine dependence, cigarettes, uncomplicated: Secondary | ICD-10-CM | POA: Insufficient documentation

## 2019-02-19 DIAGNOSIS — I10 Essential (primary) hypertension: Secondary | ICD-10-CM | POA: Diagnosis not present

## 2019-02-19 DIAGNOSIS — Y999 Unspecified external cause status: Secondary | ICD-10-CM | POA: Diagnosis not present

## 2019-02-19 DIAGNOSIS — Z7982 Long term (current) use of aspirin: Secondary | ICD-10-CM | POA: Diagnosis not present

## 2019-02-19 DIAGNOSIS — Y939 Activity, unspecified: Secondary | ICD-10-CM | POA: Insufficient documentation

## 2019-02-19 DIAGNOSIS — T148XXA Other injury of unspecified body region, initial encounter: Secondary | ICD-10-CM

## 2019-02-19 DIAGNOSIS — S199XXA Unspecified injury of neck, initial encounter: Secondary | ICD-10-CM | POA: Diagnosis present
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
## 2019-02-20 ENCOUNTER — Telehealth: Payer: Self-pay

## 2019-02-20 NOTE — Telephone Encounter (Signed)
Virtual Visit Pre-Appointment Phone Call  "Sophia Briggs, I am calling you today to discuss your upcoming appointment. We are currently trying to limit exposure to the virus that causes COVID-19 by seeing patients at home rather than in the office."  1. "What is the BEST phone number to call the day of the visit?" - include this in appointment notes  2. "Do you have or have access to (through a family member/friend) a smartphone with video capability that we can use for your visit?" a. If yes - list this number in appt notes as "cell" (if different from BEST phone #) and list the appointment type as a VIDEO visit in appointment notes b. If no - list the appointment type as a PHONE visit in appointment notes  3. Confirm consent - "In the setting of the current Covid19 crisis, you are scheduled for a video visit with your provider on March 07, 2019 at 2:00PM.  Just as we do with many in-office visits, in order for you to participate in this visit, we must obtain consent.  If you'd like, I can send this to your mychart (if signed up) or email for you to review.  Otherwise, I can obtain your verbal consent now.  All virtual visits are billed to your insurance company just like a normal visit would be.  By agreeing to a virtual visit, we'd like you to understand that the technology does not allow for your provider to perform an examination, and thus may limit your provider's ability to fully assess your condition. If your provider identifies any concerns that need to be evaluated in person, we will make arrangements to do so.  Finally, though the technology is pretty good, we cannot assure that it will always work on either your or our end, and in the setting of a video visit, we may have to convert it to a phone-only visit.  In either situation, we cannot ensure that we have a secure connection.  Are you willing to proceed?" STAFF: Did the patient verbally acknowledge consent to telehealth visit? Document  YES/NO here: YES PER DAUGHTER Sophia Briggs (LISTED ON DPR)  4. Advise patient to be prepared - "Two hours prior to your appointment, go ahead and check your blood pressure, pulse, oxygen saturation, and your weight (if you have the equipment to check those) and write them all down. When your visit starts, your provider will ask you for this information. If you have an Apple Watch or Kardia device, please plan to have heart rate information ready on the day of your appointment. Please have a pen and paper handy nearby the day of the visit as well."  5. Give patient instructions for MyChart download to smartphone OR Doximity/Doxy.me as below if video visit (depending on what platform provider is using)  6. Inform patient they will receive a phone call 15 minutes prior to their appointment time (may be from unknown caller ID) so they should be prepared to answer    TELEPHONE CALL NOTE  Sophia Briggs has been deemed a candidate for a follow-up tele-health visit to limit community exposure during the Covid-19 pandemic. I spoke with the patient via phone to ensure availability of phone/video source, confirm preferred email & phone number, and discuss instructions and expectations.  I reminded Sophia Briggs to be prepared with any vital sign and/or heart rhythm information that could potentially be obtained via home monitoring, at the time of her visit. I reminded Sophia Briggs to  expect a phone call prior to her visit.  Rene Paci McClain 02/20/2019 1:59 PM   INSTRUCTIONS FOR DOWNLOADING THE MYCHART APP TO SMARTPHONE  - The patient must first make sure to have activated MyChart and know their login information - If Apple, go to [employer] and type in MyChart in the search bar and download the app. If Android, ask patient to go to [employer] and type in [place] in the search bar and download the app. The app is free but as with any other app downloads, their phone may require  them to verify saved payment information or Apple/Android password.  - The patient will need to then log into the app with their MyChart username and password, and select [facility] as their healthcare provider to link the account. When it is time for your visit, go to the MyChart app, find appointments, and click Begin Video Visit. Be sure to Select Allow for your device to access the Microphone and Camera for your visit. You will then be connected, and your provider will be with you shortly.  **If they have any issues connecting, or need assistance please contact MyChart service desk [phone])**  **If using a computer, in order to ensure the best quality for their visit they will need to use either of the following Internet Browsers: [employer], or Google Chrome**  IF USING DOXIMITY or DOXY.ME - The patient will receive a link just prior to their visit by text.     FULL LENGTH CONSENT FOR TELE-HEALTH VISIT   I hereby voluntarily request, consent and authorize [facility] and its employed or contracted physicians, physician assistants, nurse practitioners or other licensed health care professionals (the Practitioner), to provide me with telemedicine health care services (the "Services") as deemed necessary by the treating Practitioner. I acknowledge and consent to receive the Services by the Practitioner via telemedicine. I understand that the telemedicine visit will involve communicating with the Practitioner through live audiovisual communication technology and the disclosure of certain medical information by electronic transmission. I acknowledge that I have been given the opportunity to request an in-person assessment or other available alternative prior to the telemedicine visit and am voluntarily participating in the telemedicine visit.  I understand that I have the right to withhold or withdraw my consent to the use of telemedicine in the course of my care at any  time, without affecting my right to future care or treatment, and that the Practitioner or I may terminate the telemedicine visit at any time. I understand that I have the right to inspect all information obtained and/or recorded in the course of the telemedicine visit and may receive copies of available information for a reasonable fee.  I understand that some of the potential risks of receiving the Services via telemedicine include:  Marland Kitchen Delay or interruption in medical evaluation due to technological equipment failure or disruption; . Information transmitted may not be sufficient (e.g. poor resolution of images) to allow for appropriate medical decision making by the Practitioner; and/or  . In rare instances, security protocols could fail, causing a breach of personal health information.  Furthermore, I acknowledge that it is my responsibility to provide information about my medical history, conditions and care that is complete and accurate to the best of my ability. I acknowledge that Practitioner's advice, recommendations, and/or decision may be based on factors not within their control, such as incomplete or inaccurate data provided by me or distortions of diagnostic images or specimens  that may result from electronic transmissions. I understand that the practice of medicine is not an exact science and that Practitioner makes no warranties or guarantees regarding treatment outcomes. I acknowledge that I will receive a copy of this consent concurrently upon execution via email to the email address I last provided but may also request a printed copy by calling the office of [facility].    I understand that my insurance will be billed for this visit.   I have read or had this consent read to me. . I understand the contents of this consent, which adequately explains the benefits and risks of the Services being provided via telemedicine.  . I have been provided ample opportunity to ask questions  regarding this consent and the Services and have had my questions answered to my satisfaction. . I give my informed consent for the services to be provided through the use of telemedicine in my medical care  By participating in this telemedicine visit I agree to the above.

## 2019-03-07 ENCOUNTER — Telehealth: Payer: Medicare HMO | Admitting: Cardiovascular Disease
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
## 2019-03-15 ENCOUNTER — Telehealth (INDEPENDENT_AMBULATORY_CARE_PROVIDER_SITE_OTHER): Payer: Medicare HMO | Admitting: Cardiovascular Disease

## 2019-03-15 ENCOUNTER — Other Ambulatory Visit: Payer: Self-pay

## 2019-03-15 VITALS — BP 169/97 | Ht 67.0 in | Wt 159.0 lb

## 2019-03-15 DIAGNOSIS — I25118 Atherosclerotic heart disease of native coronary artery with other forms of angina pectoris: Secondary | ICD-10-CM | POA: Diagnosis not present

## 2019-03-15 DIAGNOSIS — E782 Mixed hyperlipidemia: Secondary | ICD-10-CM

## 2019-03-15 DIAGNOSIS — Z720 Tobacco use: Secondary | ICD-10-CM | POA: Diagnosis not present

## 2019-03-15 DIAGNOSIS — I10 Essential (primary) hypertension: Secondary | ICD-10-CM | POA: Diagnosis not present

## 2019-03-15 MED ORDER — ISOSORBIDE MONONITRATE ER 30 MG PO TB24: 30.0000 mg | ORAL_TABLET | Freq: Two times a day (BID) | ORAL | 3 refills | Status: DC

## 2019-03-15 MED ORDER — NITROGLYCERIN 0.4 MG SL SUBL: 0.4000 mg | SUBLINGUAL_TABLET | SUBLINGUAL | 2 refills | Status: DC | PRN

## 2019-03-15 NOTE — Patient Instructions (Addendum)
Medication Instructions:  Your physician has recommended you make the following change in your medication:  1. INCREASE Isosorbide mononitrate 30 mg twice a day  The proper use and anticipated side effects of nitroglycerine has been carefully explained.  If a single episode of chest pain is not relieved by one tablet, the patient will try another within 5 minutes; and if this doesn't relieve the pain, the patient is instructed to call 911 for transportation to an emergency department.   If you need a refill on your cardiac medications before your next appointment, please call your pharmacy.    Lab work: No new labs needed   If you have labs (blood work) drawn today and your tests are completely normal, you will receive your results only by: Marland Kitchen MyChart Message (if you have MyChart) OR . A paper copy in the mail If you have any lab test that is abnormal or we need to change your treatment, we will call you to review the results.   Testing/Procedures: No new testing needed   Follow-Up: At [facility], you and your health needs are our priority.  As part of our continuing mission to provide you with exceptional heart care, we have created designated Provider Care Teams.  These Care Teams include your primary Cardiologist (physician) and Advanced Practice Providers (APPs -  Physician Assistants and Nurse Practitioners) who all work together to provide you with the care you need, when you need it.  . You will need a follow up appointment in 12 months .   Please call our office 2 months in advance to schedule this appointment.    . Providers on your designated Care Team:   . Murray Hodgkins, NP . Christell Faith, PA-C . Marrianne Mood, PA-C  Any Other Special Instructions Will Be Listed Below (If Applicable).  For educational health videos Log in to : [email or web address] Or : [email or web address], password : [email or web address]  Nitroglycerin sublingual tablets What is this medicine? NITROGLYCERIN (nye  troe GLI ser in) is a type of vasodilator. It relaxes blood vessels, increasing the blood and oxygen supply to your heart. This medicine is used to relieve chest pain caused by angina. It is also used to prevent chest pain before activities like climbing stairs, going outdoors in cold weather, or sexual activity. This medicine may be used for other purposes; ask your health care provider or pharmacist if you have questions. COMMON BRAND NAME(S): Nitroquick, Nitrostat, Nitrotab What should I tell my health care provider before I take this medicine? They need to know if you have any of these conditions: -anemia -head injury, recent stroke, or bleeding in the brain -liver disease -previous heart attack -an unusual or allergic reaction to nitroglycerin, other medicines, foods, dyes, or preservatives -pregnant or trying to get pregnant -breast-feeding How should I use this medicine? Take this medicine by mouth as needed. At the first sign of an angina attack (chest pain or tightness) place one tablet under your tongue. You can also take this medicine 5 to 10 minutes before an event likely to produce chest pain. Follow the directions on the prescription label. Let the tablet dissolve under the tongue. Do not swallow whole. Replace the dose if you accidentally swallow it. It will help if your mouth is not dry. Saliva around the tablet will help it to dissolve more quickly. Do not eat or drink, smoke or chew tobacco while a tablet is dissolving. If you are not better within 5 minutes after taking ONE dose of nitroglycerin,  call 9-1-1 immediately to seek emergency medical care. Do not take more than 3 nitroglycerin tablets over 15 minutes. If you take this medicine often to relieve symptoms of angina, your doctor or health care professional may provide you with different instructions to manage your symptoms. If symptoms do not go away after following these instructions, it is important to call 9-1-1  immediately. Do not take more than 3 nitroglycerin tablets over 15 minutes. Talk to your pediatrician regarding the use of this medicine in children. Special care may be needed. Overdosage: If you think you have taken too much of this medicine contact a poison control center or emergency room at once. NOTE: This medicine is only for you. Do not share this medicine with others. What if I miss a dose? This does not apply. This medicine is only used as needed. What may interact with this medicine? Do not take this medicine with any of the following medications: -certain migraine medicines like ergotamine and dihydroergotamine (DHE) -medicines used to treat erectile dysfunction like sildenafil, tadalafil, and vardenafil -riociguat This medicine may also interact with the following medications: -alteplase -aspirin -heparin -medicines for high blood pressure -medicines for mental depression -other medicines used to treat angina -phenothiazines like chlorpromazine, mesoridazine, prochlorperazine, thioridazine This list may not describe all possible interactions. Give your health care provider a list of all the medicines, herbs, non-prescription drugs, or dietary supplements you use. Also tell them if you smoke, drink alcohol, or use illegal drugs. Some items may interact with your medicine. What should I watch for while using this medicine? Tell your doctor or health care professional if you feel your medicine is no longer working. Keep this medicine with you at all times. Sit or lie down when you take your medicine to prevent falling if you feel dizzy or faint after using it. Try to remain calm. This will help you to feel better faster. If you feel dizzy, take several deep breaths and lie down with your feet propped up, or bend forward with your head resting between your knees. You may get drowsy or dizzy. Do not drive, use machinery, or do anything that needs mental alertness until you know how  this drug affects you. Do not stand or sit up quickly, especially if you are an older patient. This reduces the risk of dizzy or fainting spells. Alcohol can make you more drowsy and dizzy. Avoid alcoholic drinks. Do not treat yourself for coughs, colds, or pain while you are taking this medicine without asking your doctor or health care professional for advice. Some ingredients may increase your blood pressure. What side effects may I notice from receiving this medicine? Side effects that you should report to your doctor or health care professional as soon as possible: -blurred vision -dry mouth -skin rash -sweating -the feeling of extreme pressure in the head -unusually weak or tired Side effects that usually do not require medical attention (report to your doctor or health care professional if they continue or are bothersome): -flushing of the face or neck -headache -irregular heartbeat, palpitations -nausea, vomiting This list may not describe all possible side effects. Call your doctor for medical advice about side effects. You may report side effects to FDA at 1-800-FDA-1088. Where should I keep my medicine? Keep out of the reach of children. Store at room temperature between 20 and 25 degrees C (68 and 77 degrees F). Store in [facility] [employer]. Protect from light and moisture. Keep tightly closed. Throw away any unused medicine after  the expiration date. NOTE: This sheet is a summary. It may not cover all possible information. If you have questions about this medicine, talk to your doctor, pharmacist, or health care provider.  2019 Elsevier/Gold Standard (2013-07-18 17:57:36)

## 2019-03-28 ENCOUNTER — Ambulatory Visit: Payer: Medicare HMO | Admitting: Nurse Practitioner

## 2019-04-10 ENCOUNTER — Other Ambulatory Visit: Payer: Self-pay | Admitting: Nurse Practitioner

## 2019-04-10 DIAGNOSIS — K5900 Constipation, unspecified: Secondary | ICD-10-CM

## 2019-04-10 MED ORDER — LUBIPROSTONE 24 MCG PO CAPS: 24.0000 ug | ORAL_CAPSULE | Freq: Two times a day (BID) | ORAL | 3 refills | Status: DC

## 2019-04-16 ENCOUNTER — Telehealth: Payer: Self-pay

## 2019-04-16 NOTE — Telephone Encounter (Signed)
Pt is requesting a different medication than Amitiza 24 mcg. IT is going through her insurance but it will cost her $285.00. Forwarding to Walden Field, NP to advise!

## 2019-04-17 NOTE — Telephone Encounter (Signed)
Lets have her pick up a sample pack of Trulance. Take this once a day with or without food.  Call with progress report in 1-2 weeks.

## 2019-04-18 NOTE — Telephone Encounter (Signed)
Pt returning Alicia's call.  [phone].

## 2019-04-18 NOTE — Telephone Encounter (Signed)
Lmom, waiting on a return call.  

## 2019-04-18 NOTE — Telephone Encounter (Signed)
Spoke with pts daughter, they will pick samples of Trulance up today or tomorrow. Pt will call back with an update.

## 2019-07-02 ENCOUNTER — Other Ambulatory Visit: Payer: Self-pay

## 2019-07-02 ENCOUNTER — Emergency Department (HOSPITAL_COMMUNITY)
Admission: EM | Admit: 2019-07-02 | Discharge: 2019-07-03 | Disposition: A | Discharge status: Home / Self Care | Payer: Medicare HMO | Attending: Emergency Medicine | Admitting: Emergency Medicine

## 2019-07-02 DIAGNOSIS — Z7982 Long term (current) use of aspirin: Secondary | ICD-10-CM | POA: Insufficient documentation

## 2019-07-02 DIAGNOSIS — I259 Chronic ischemic heart disease, unspecified: Secondary | ICD-10-CM | POA: Insufficient documentation

## 2019-07-02 DIAGNOSIS — Z955 Presence of coronary angioplasty implant and graft: Secondary | ICD-10-CM | POA: Insufficient documentation

## 2019-07-02 DIAGNOSIS — J449 Chronic obstructive pulmonary disease, unspecified: Secondary | ICD-10-CM | POA: Diagnosis not present

## 2019-07-02 DIAGNOSIS — R42 Dizziness and giddiness: Secondary | ICD-10-CM

## 2019-07-02 DIAGNOSIS — Z8673 Personal history of transient ischemic attack (TIA), and cerebral infarction without residual deficits: Secondary | ICD-10-CM | POA: Insufficient documentation

## 2019-07-02 DIAGNOSIS — F17210 Nicotine dependence, cigarettes, uncomplicated: Secondary | ICD-10-CM | POA: Diagnosis not present

## 2019-07-02 DIAGNOSIS — I10 Essential (primary) hypertension: Secondary | ICD-10-CM | POA: Diagnosis not present

## 2019-07-02 DIAGNOSIS — Z79899 Other long term (current) drug therapy: Secondary | ICD-10-CM | POA: Diagnosis not present

## 2019-07-02 MED ORDER — ONDANSETRON HCL 4 MG PO TABS: 4.0000 mg | ORAL_TABLET | Freq: Three times a day (TID) | ORAL | 0 refills | Status: DC | PRN

## 2019-07-02 MED ORDER — MECLIZINE HCL 12.5 MG PO TABS
25.0000 mg | ORAL_TABLET | Freq: Once | ORAL | Status: AC
  Administered 2019-07-02: 25 mg via ORAL
  Filled 2019-07-02: qty 2

## 2019-07-02 MED ORDER — MECLIZINE HCL 25 MG PO TABS: 25.0000 mg | ORAL_TABLET | Freq: Four times a day (QID) | ORAL | 0 refills | Status: DC | PRN

## 2019-07-02 NOTE — ED Triage Notes (Signed)
Pt has a history of vertigo. States movement makes it worse. Was taking Meclizine when she had vertigo before. Has not taken any with this episode.

## 2019-07-02 NOTE — Discharge Instructions (Addendum)
Take the meclizine as prescribed for dizziness and use the Zofran if needed for nausea or vomiting.  Please be very careful when you go up or down stairs or do any activity that you could fall and harm yourself if you got a dizzy episode.  Recheck if you get a headache or any numbness or weakness in your arms or legs.  Please let Dr. Legrand Rams know if your symptoms are lasting more than a week.

## 2019-07-02 NOTE — ED Provider Notes (Signed)
[facility] EMERGENCY DEPARTMENT Provider Note   CSN: [id number] Arrival date & time: 07/02/19  1538   Time seen 2310  History   Chief Complaint Chief Complaint  Patient presents with   Dizziness    HPI Sophia Briggs is a 73 y.o. female.     HPI patient states she started having some dizziness yesterday but it got much worse today.  She states while she was in bed she felt like she was falling off the bed.  She states any type of movement would make his symptoms worse.  She did not notice a specific activity such as sitting or lying or looking to the left or right as being the precipitating cause.  She did have nausea and vomited 3 or 4 times today.  She denies headache, tingling her extremities, she did note she had some numbness in her left foot for 2 to 3 minutes this morning but that has resolved.  She had to have assistance in walking and her family member got her a cane and walked with her.  There have been no changes in her medications.  She has been waiting in the ED for many hours and states now her symptoms have resolved.  She states she had something similar several years ago.  PCP Rosita Fire, MD   Past Medical History:  Diagnosis Date   Acute [place] [facility])    Allergy    Anxiety    Arthritis    all over   Back pain    COPD (chronic obstructive pulmonary disease) ([place])    Coronary artery disease    CTS (carpal tunnel syndrome) 01/06/2012   GERD (gastroesophageal reflux disease)    Hyperlipidemia    Hypertension    Stroke [facility])    Syncope and collapse    TIA (transient ischemic attack)     Patient Active Problem List   Diagnosis Date Noted   Polyp of colon, adenomatous 08/07/2018   Positive colorectal cancer screening using Cologuard test 04/17/2018   Constipation 04/17/2018   Tobacco abuse 06/22/2017   Angina pectoris ([place]) 08/05/2015   Lumbar radiculopathy 10/11/2013   Stenosis of lumbosacral spine 07/26/2013   CAD (coronary  artery disease) 05/06/2013   Chest pain 05/06/2013   Hyperlipidemia 05/06/2013   Smoking history 05/06/2013   CTS (carpal tunnel syndrome) 01/06/2012   HLD (hyperlipidemia) 04/13/2009   THROMBOCYTOPENIA 04/13/2009   TRANSIENT ISCHEMIC ATTACK 04/13/2009   EPISTAXIS 04/13/2009    Past Surgical History:  Procedure Laterality Date   CARDIAC CATHETERIZATION     stent placement 2006 at [facility]    COLONOSCOPY N/A 06/08/2018   Procedure: COLONOSCOPY;  Surgeon: Danie Binder, MD;  Location: [facility] ENDO SUITE;  Service: Endoscopy;  Laterality: N/A;  12:15pm   PAROTIDECTOMY     POLYPECTOMY  06/08/2018   Procedure: POLYPECTOMY;  Surgeon: Danie Binder, MD;  Location: [facility] ENDO SUITE;  Service: Endoscopy;;  cecal polyps times 6 ( 4cs, 2 hs), ascending colon polyps ( 2cs, 2hs) hepatic flexure polyps (4 cs, 5 hs), transverse colon polyps  splenic flexure polyp times 2 cs, 2 cb   TUBAL LIGATION       OB History   No obstetric history on file.      Home Medications    Prior to Admission medications   Medication Sig Start Date End Date Taking? Authorizing Provider  aspirin 81 MG tablet Take 162 mg by mouth at bedtime.  09/17/08  Yes [provider]  atorvastatin (  LIPITOR) 40 MG tablet Take 40 mg by mouth at bedtime.    Yes [provider]  carvedilol (COREG) 25 MG tablet Take 1 tablet (25 mg total) by mouth 2 (two) times daily with a meal. 07/07/17  Yes Raylene Everts, MD  cholecalciferol (VITAMIN D) 1000 UNITS tablet Take 1,000 Units by mouth daily.    Yes [provider]  isosorbide mononitrate (IMDUR) 30 MG 24 hr tablet Take 1 tablet (30 mg total) by mouth 2 (two) times a day. Patient taking differently: Take 30 mg by mouth daily.  03/15/19  Yes Gollan, Kathlene November, MD  losartan-hydrochlorothiazide (HYZAAR) 100-25 MG tablet Take 1 tablet by mouth daily. 06/05/19  Yes [provider]  lubiprostone (AMITIZA) 24 MCG capsule Take 1 capsule (24  mcg total) by mouth 2 (two) times daily with a meal. Patient taking differently: Take 24 mcg by mouth daily with breakfast.  04/10/19  Yes Carlis Stable, NP  nitroGLYCERIN (NITROSTAT) 0.4 MG SL tablet Place 1 tablet (0.4 mg total) under the tongue every 5 (five) minutes as needed for chest pain. 03/15/19  Yes Gollan, Kathlene November, MD  pantoprazole (PROTONIX) 40 MG tablet Take 1 tablet (40 mg total) by mouth daily. Patient taking differently: Take 40 mg by mouth at bedtime.  07/07/17  Yes Raylene Everts, MD  meclizine (ANTIVERT) 25 MG tablet Take 1 tablet (25 mg total) by mouth every 6 (six) hours as needed for dizziness or nausea. 07/02/19   Rolland Porter, MD  ondansetron (ZOFRAN) 4 MG tablet Take 1 tablet (4 mg total) by mouth every 8 (eight) hours as needed for nausea or vomiting. 07/02/19   Rolland Porter, MD    Family History Family History  Adopted: Yes  Problem Relation Age of Onset   Arthritis Mother    Hypertension Mother    Arthritis Father    Cancer Father        prostate   Early death Son        MVA   Heart disease Brother 6   Aneurysm Sister    Colon cancer Neg Hx     Social History Social History   Tobacco Use   Smoking status: Current Every Day Smoker    Packs/day: 0.10    Years: 10.00    Pack years: 1.00    Types: Cigarettes   Smokeless tobacco: Never Used   Tobacco comment: 1.5 cigarettes per day  Substance Use Topics   Alcohol use: Yes    Comment: occ   Drug use: Yes    Frequency: 3.0 times per week    Types: Marijuana    Comment: occ  lives at home Lives alone with several people who check on her during the day or stay with her at night   Allergies   Amlodipine, Atorvastatin, Clopidogrel bisulfate, and Linzess [linaclotide]   Review of Systems Review of Systems  All other systems reviewed and are negative.    Physical Exam Updated Vital Signs BP (!) 167/82    Pulse 66    Temp 97.8 F (36.6 C)    Resp 17    Ht 5\' 7"  (1.702 m)    Wt 65.8 kg     LMP 11/14/1997    SpO2 100%    BMI 22.71 kg/m   Physical Exam Vitals signs and nursing note reviewed.  Constitutional:      General: She is not in acute distress.    Appearance: Normal appearance. She is well-developed. She is not  ill-appearing or toxic-appearing.  HENT:     Head: Normocephalic and atraumatic.     Right Ear: External ear normal.     Left Ear: External ear normal.     Nose: Nose normal. No mucosal edema or rhinorrhea.     Mouth/Throat:     Dentition: No dental abscesses.     Pharynx: No uvula swelling.  Eyes:     Extraocular Movements: Extraocular movements intact.     Right eye: No nystagmus.     Left eye: No nystagmus.     Conjunctiva/sclera: Conjunctivae normal.     Pupils: Pupils are equal, round, and reactive to light.  Neck:     Musculoskeletal: Full passive range of motion without pain, normal range of motion and neck supple.  Cardiovascular:     Rate and Rhythm: Normal rate and regular rhythm.     Heart sounds: Normal heart sounds. No murmur. No friction rub. No gallop.   Pulmonary:     Effort: Pulmonary effort is normal. No respiratory distress.     Breath sounds: Normal breath sounds. No wheezing, rhonchi or rales.  Chest:     Chest wall: No tenderness or crepitus.  Musculoskeletal: Normal range of motion.        General: No tenderness.     Comments: Moves all extremities well.   Skin:    General: Skin is warm and dry.     Coloration: Skin is not pale.     Findings: No erythema or rash.  Neurological:     General: No focal deficit present.     Mental Status: She is alert and oriented to person, place, and time.     Cranial Nerves: No cranial nerve deficit.     Comments: Grips are equal, pt is right handed.  Pronator drift is negative.  She has no motor weakness.  Psychiatric:        Mood and Affect: Mood normal. Mood is not anxious.        Speech: Speech normal.        Behavior: Behavior normal.        Thought Content: Thought content  normal.      ED Treatments / Results  Labs (all labs ordered are listed, but only abnormal results are displayed) Labs Reviewed - No data to display  EKG EKG Interpretation  Date/Time:  Tuesday July 02 2019 17:28:38 EDT Ventricular Rate:  57 PR Interval:  172 QRS Duration: 126 QT Interval:  456 QTC Calculation: 443 R Axis:   56 Text Interpretation:  Sinus bradycardia with Premature atrial complexes Non-specific intra-ventricular conduction block No significant change since last tracing 16 Apr 2013 Confirmed by Rolland Porter [phone]) on 07/02/2019 11:02:53 PM   Radiology No results found.  Procedures Procedures (including critical care time)  Medications Ordered in ED Medications  meclizine (ANTIVERT) tablet 25 mg (has no administration in time range)     Initial Impression / Assessment and Plan / ED Course  I have reviewed the triage vital signs and the nursing notes.  Pertinent labs & imaging results that were available during my care of the patient were reviewed by me and considered in my medical decision making (see chart for details).        Patient was given meclizine prior to leaving the ED because there are no drugstores open at night and if she was going to have another episode she would be without medication.  Her symptoms are suggestive of vertigo.  She has had  it before.  She had no worrisome symptoms such as headache or persistent neurological changes.  She was discharged home with meclizine and Zofran.  Final Clinical Impressions(s) / ED Diagnoses   Final diagnoses:  Vertigo  Dizziness    ED Discharge Orders         Ordered    meclizine (ANTIVERT) 25 MG tablet  Every 6 hours PRN     07/02/19 2321    ondansetron (ZOFRAN) 4 MG tablet  Every 8 hours PRN     07/02/19 2321         Plan discharge  Rolland Porter, MD, Barbette Or, MD 07/02/19 2326

## 2019-07-03 ENCOUNTER — Other Ambulatory Visit: Payer: Self-pay | Admitting: Cardiovascular Disease

## 2019-10-07 ENCOUNTER — Ambulatory Visit: Payer: Medicare HMO | Attending: Internal Medicine

## 2019-10-07 ENCOUNTER — Other Ambulatory Visit: Payer: Self-pay

## 2019-10-07 DIAGNOSIS — Z20822 Contact with and (suspected) exposure to covid-19: Secondary | ICD-10-CM

## 2019-10-08 LAB — NOVEL CORONAVIRUS, NAA: SARS-CoV-2, NAA: DETECTED — AB

## 2019-10-09 ENCOUNTER — Telehealth: Payer: Self-pay | Admitting: Nurse Practitioner

## 2019-10-09 NOTE — Telephone Encounter (Signed)
Called to Discuss with patient about Covid symptoms and the use of bamlanivimab, a monoclonal antibody infusion for those with mild to moderate Covid symptoms and at a high risk of hospitalization.  Spoke with patient's daughter.    Pt is qualified for this infusion at the [facility] infusion center due to co-morbid conditions and/or a member of an at-risk group.     Patient Active Problem List   Diagnosis Date Noted  . Polyp of colon, adenomatous 08/07/2018  . Positive colorectal cancer screening using Cologuard test 04/17/2018  . Constipation 04/17/2018  . Tobacco abuse 06/22/2017  . Angina pectoris ([place]) 08/05/2015  . Lumbar radiculopathy 10/11/2013  . 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 CAD (coronary artery disease) 05/06/2013  . Chest pain 05/06/2013  . Hyperlipidemia 05/06/2013  . Smoking history 05/06/2013  . CTS (carpal tunnel syndrome) 01/06/2012  . HLD (hyperlipidemia) 04/13/2009  . THROMBOCYTOPENIA 04/13/2009  . TRANSIENT ISCHEMIC ATTACK 04/13/2009  . EPISTAXIS 04/13/2009    Daughter states that patient is not having any symptoms  at this time. Symptoms tier reviewed as well as criteria for ending isolation. Preventative practices reviewed. Patient verbalized understanding.    Patient advised to call back if she does develop symptoms and decides that she does want to get infusion. Callback number to the infusion center given. Patient advised to go to Urgent care or ED with severe symptoms.

## 2019-11-14 ENCOUNTER — Ambulatory Visit: Payer: Medicare HMO | Attending: Internal Medicine

## 2019-11-14 ENCOUNTER — Other Ambulatory Visit: Payer: Self-pay

## 2019-11-14 DIAGNOSIS — Z23 Encounter for immunization: Secondary | ICD-10-CM | POA: Insufficient documentation

## 2019-11-14 NOTE — Progress Notes (Signed)
   Covid-19 Vaccination Clinic  Name:  Sophia Briggs    MRN: [id number] DOB: November 11, 1945  11/14/2019  Sophia Briggs was observed post Covid-19 immunization for 15 minutes without incidence. She was provided with Vaccine Information Sheet and instruction to access the V-Safe system.   Sophia Briggs was instructed to call 911 with any severe reactions post vaccine: Marland Kitchen Difficulty breathing  . Swelling of your face and throat  . A fast heartbeat  . A bad rash all over your body  . Dizziness and weakness    Immunizations Administered    Name Date Dose VIS Date Route   Moderna COVID-19 Vaccine 11/14/2019 12:43 PM 0.5 mL 09/03/2019 Intramuscular   Manufacturer: Moderna   Lot: [id number]   [id number]

## 2019-12-16 ENCOUNTER — Ambulatory Visit: Payer: Medicare HMO | Attending: Internal Medicine

## 2019-12-16 DIAGNOSIS — Z23 Encounter for immunization: Secondary | ICD-10-CM

## 2019-12-16 NOTE — Progress Notes (Signed)
   Covid-19 Vaccination Clinic  Name:  Sophia Briggs    MRN: [id number] DOB: [birth year]  12/16/2019  Sophia Briggs was observed post Covid-19 immunization for 15 minutes without incident. She was provided with Vaccine Information Sheet and instruction to access the V-Safe system.   Sophia Briggs was instructed to call 911 with any severe reactions post vaccine: Marland Kitchen Difficulty breathing  . Swelling of face and throat  . A fast heartbeat  . A bad rash all over body  . Dizziness and weakness   Immunizations Administered    Name Date Dose VIS Date Route   Moderna COVID-19 Vaccine 12/16/2019 12:34 PM 0.5 mL 09/03/2019 Intramuscular   Manufacturer: Moderna   Lot: [id number]   [id number]

## 2020-01-30 ENCOUNTER — Encounter: Payer: Self-pay | Admitting: Cardiovascular Disease

## 2020-05-31 NOTE — Progress Notes (Signed)
Cardiology Office Note    Date:  06/01/2020   ID:  Laniah, Grimm [birth year], MRN [id number]  PCP:  Rosita Fire, MD  Cardiologist:  Ida Rogue, MD  Electrophysiologist:  None   Chief Complaint: Follow-up  History of Present Illness:   Sophia Briggs is a 74 y.o. female with history of CAD status post PCI x2 to the LAD in 2009, TIA, COVID-19 infection in 10/2019, HTN, HLD, and tobacco use who presents for follow-up of her CAD.  She was admitted to the hospital in 04/2013 with chest pain and elevated troponin.  Diagnostic LHC showed severe still D2 and D3 disease.  The D2 ostium was at the distal end of the LAD stent.  LAD stent was patent.  D3 was a small vessel.  EF 55%.  No intervention was performed.  She was most recently seen virtually in 03/2019 and was doing well from a cardiac perspective though did continue to smoke less than half pack per day.  With elevated home BP readings Imdur was titrated to 30 mg twice daily.  She comes in doing well from a cardiac perspective.  No chest pain, dyspnea, palpitations, dizziness, presyncope, syncope, lower extremity swelling, abdominal distention, orthopnea, PND, or early satiety.  She does note some mild fatigue if she ambulates for approximately a block and a half to a friend's house, though this is stable and longstanding.  She does continue to smoke approximately 5 cigarettes/day, more if she is stressed out.  She does have a documented intolerance to atorvastatin and rosuvastatin with myalgias/arthralgias though has been taking Lipitor.  She does think this is causing some knee pain and would like to undergo a trial of holding this to see if it helps.  She has not yet taken any of her blood pressure medication.  She does not have any issues or concerns at this time.   Labs independently reviewed: 12/2018 - TC 143, TG 60, HDL 54, LDL 75, BUN 14, serum creatinine 0.92, potassium 4.3, A1c 5.6, albumin 3.8, AST 127, ALT 102, Hgb  12.9, PLT 103  Past Medical History:  Diagnosis Date  . Acute MI ([place])   . Allergy   . Anxiety   . Arthritis    all over  . Back pain   . COPD (chronic obstructive pulmonary disease) ([place])   . Coronary artery disease   . CTS (carpal tunnel syndrome) 01/06/2012  . GERD (gastroesophageal reflux disease)   . Hyperlipidemia   . Hypertension   . Stroke ([place])   . Syncope and collapse   . TIA (transient ischemic attack)     Past Surgical History:  Procedure Laterality Date  . CARDIAC CATHETERIZATION     stent placement 2006 at [facility]   . COLONOSCOPY N/A 06/08/2018   Procedure: COLONOSCOPY;  Surgeon: Danie Binder, MD;  Location: [facility];  Service: Endoscopy;  Laterality: N/A;  12:15pm  . PAROTIDECTOMY    . POLYPECTOMY  06/08/2018   Procedure: POLYPECTOMY;  Surgeon: Danie Binder, MD;  Location: [facility];  Service: Endoscopy;;  cecal polyps times 6 ( 4cs, 2 hs), ascending colon polyps ( 2cs, 2hs) hepatic flexure polyps (4 cs, 5 hs), transverse colon polyps  splenic flexure polyp times 2 cs, 2 cb  . TUBAL LIGATION      Current Medications: Current Meds  Medication Sig  . aspirin 81 MG tablet Take 162 mg by mouth at bedtime.   Marland Kitchen atorvastatin (LIPITOR) 40 MG  tablet Take 40 mg by mouth at bedtime.   . carvedilol (COREG) 25 MG tablet Take 1 tablet (25 mg total) by mouth 2 (two) times daily with a meal.  . cholecalciferol (VITAMIN D) 1000 UNITS tablet Take 1,000 Units by mouth daily.   . isosorbide mononitrate (IMDUR) 30 MG 24 hr tablet Take 30 mg by mouth daily.  Marland Kitchen losartan-hydrochlorothiazide (HYZAAR) 100-25 MG tablet Take 1 tablet by mouth daily.  Marland Kitchen lubiprostone (AMITIZA) 24 MCG capsule Take 24 mcg by mouth daily with breakfast.  . meclizine (ANTIVERT) 25 MG tablet Take 1 tablet (25 mg total) by mouth every 6 (six) hours as needed for dizziness or nausea.  . nitroGLYCERIN (NITROSTAT) 0.4 MG SL tablet DISSOLVE ONE TABLET UNDER THE TONGUE EVERY 5 MINUTES AS NEEDED  FOR CHEST PAIN.  DO NOT EXCEED A TOTAL OF 3 DOSES IN 15 MINUTES  . ondansetron (ZOFRAN) 4 MG tablet Take 1 tablet (4 mg total) by mouth every 8 (eight) hours as needed for nausea or vomiting.  . pantoprazole (PROTONIX) 40 MG tablet Take 1 tablet (40 mg total) by mouth daily.    Allergies:   Amlodipine, Atorvastatin, Clopidogrel bisulfate, and Linzess [linaclotide]   Social History   Socioeconomic History  . Marital status: Single    Spouse name: Not on file  . Number of children: Not on file  . Years of education: Not on file  . Highest education level: Not on file  Occupational History  . Not on file  Tobacco Use  . Smoking status: Current Every Day Smoker    Packs/day: 0.10    Years: 10.00    Pack years: 1.00    Types: Cigarettes  . Smokeless tobacco: Never Used  . Tobacco comment: up to 5 cigarettes per day  Vaping Use  . Vaping Use: Never used  Substance and Sexual Activity  . Alcohol use: Yes    Comment: occ  . Drug use: Yes    Frequency: 3.0 times per week    Types: Marijuana    Comment: occ  . Sexual activity: Not Currently  Other Topics Concern  . Not on file  Social History Narrative  . Not on file   Social Determinants of Health   Financial Resource Strain:   . Difficulty of Paying Living Expenses: Not on file  Food Insecurity:   . Worried About Charity fundraiser in the Last Year: Not on file  . Ran Out of Food in the Last Year: Not on file  Transportation Needs:   . Lack of Transportation (Medical): Not on file  . Lack of Transportation (Non-Medical): Not on file  Physical Activity:   . Days of Exercise per Week: Not on file  . Minutes of Exercise per Session: Not on file  Stress:   . Feeling of Stress : Not on file  Social Connections:   . Frequency of Communication with Friends and Family: Not on file  . Frequency of Social Gatherings with Friends and Family: Not on file  . Attends Religious Services: Not on file  . Active Member of Clubs or  Organizations: Not on file  . Attends Archivist Meetings: Not on file  . Marital Status: Not on file     Family History:  The patient's family history includes Aneurysm in her sister; Arthritis in her father and mother; Cancer in her father; Early death in her son; Heart disease (age of onset: 35) in her brother; Hypertension in her mother. There is  no history of Colon cancer. She was adopted.  ROS:   Review of Systems  Constitutional: Positive for malaise/fatigue. Negative for chills, diaphoresis, fever and weight loss.  HENT: Negative for congestion.   Eyes: Negative for discharge and redness.  Respiratory: Negative for cough, sputum production, shortness of breath and wheezing.   Cardiovascular: Negative for chest pain, palpitations, orthopnea, claudication, leg swelling and PND.  Gastrointestinal: Negative for abdominal pain, heartburn, nausea and vomiting.  Musculoskeletal: Positive for joint pain. Negative for falls and myalgias.  Skin: Negative for rash.  Neurological: Negative for dizziness, tingling, tremors, sensory change, speech change, focal weakness, loss of consciousness and weakness.  Endo/Heme/Allergies: Does not bruise/bleed easily.  Psychiatric/Behavioral: Negative for substance abuse. The patient is not nervous/anxious.   All other systems reviewed and are negative.    EKGs/Labs/Other Studies Reviewed:    Studies reviewed were summarized above. The additional studies were reviewed today: As above.  EKG:  EKG is ordered today.  The EKG ordered today demonstrates NSR, 61 bpm, nonspecific IVCD, LVH, no acute ST-T changes, largely unchanged from prior  Recent Labs: No results found for requested labs within last 8760 hours.  Recent Lipid Panel    Component Value Date/Time   CHOL 137 06/22/2017 1054   TRIG 85 06/22/2017 1054   HDL 45 (L) 06/22/2017 1054   CHOLHDL 3.0 06/22/2017 1054   LDLCALC 75 06/22/2017 1054    PHYSICAL EXAM:    VS:  BP (!)  150/90 (BP Location: Left Arm, Patient Position: Sitting, Cuff Size: Normal)   Pulse 61   Ht 5\' 7"  (1.702 m)   Wt 154 lb (69.9 kg)   LMP 11/14/1997   SpO2 99%   BMI 24.12 kg/m   BMI: Body mass index is 24.12 kg/m.  Physical Exam Constitutional:      Appearance: She is well-developed.  HENT:     Head: Normocephalic and atraumatic.  Eyes:     General:        Right eye: No discharge.        Left eye: No discharge.  Neck:     Vascular: No JVD.  Cardiovascular:     Rate and Rhythm: Normal rate and regular rhythm.     Pulses: No midsystolic click and no opening snap.          Dorsalis pedis pulses are 2+ on the right side and 2+ on the left side.       Posterior tibial pulses are 2+ on the right side and 2+ on the left side.     Heart sounds: Normal heart sounds, S1 normal and S2 normal. Heart sounds not distant. No murmur heard.  No friction rub.  Pulmonary:     Effort: Pulmonary effort is normal. No respiratory distress.     Breath sounds: Normal breath sounds. No decreased breath sounds, wheezing or rales.  Chest:     Chest wall: No tenderness.  Abdominal:     General: There is no distension.     Palpations: Abdomen is soft.     Tenderness: There is no abdominal tenderness.  Musculoskeletal:     Cervical back: Normal range of motion.  Skin:    General: Skin is warm and dry.     Nails: There is no clubbing.  Neurological:     Mental Status: She is alert and oriented to person, place, and time.  Psychiatric:        Speech: Speech normal.        Behavior:  Behavior normal.        Thought Content: Thought content normal.        Judgment: Judgment normal.     Wt Readings from Last 3 Encounters:  06/01/20 154 lb (69.9 kg)  07/02/19 145 lb (65.8 kg)  03/15/19 159 lb (72.1 kg)     ASSESSMENT & PLAN:   1. CAD involving the native coronary arteries without angina: She is doing well without any symptoms concerning for angina.  Continue secondary prevention including  aspirin, carvedilol, Imdur, losartan, and sublingual nitroglycerin.  Trial of holding atorvastatin as detailed below.  No plans for ischemic evaluation at this time.  2. HTN: Blood pressure is mildly elevated in the office today with recheck at 144/84.  She has not yet taken any of her antihypertensive medication.  She indicates she typically does not take this when she goes to a physician's office, including twice daily medications.  Recommend she take medications as directed.  Continue carvedilol, Imdur, Hyzaar.  Low-sodium diet.  3. HLD: Most recent LDL on file of 75 from 12/2018.  She does indicate she has had labs obtained since then through her PCPs office.  Of note, most recent LFTs were abnormal as outlined above as well.  We will contact her PCPs office to have updated labs faxed to our office.  If there are no updated labs for review I have advised her we will place future orders for her to stop by the medical mall and have a CMP and CBC drawn.  She does have documented arthralgias with atorvastatin though has been taking this on a nightly basis.  She will undergo a trial of holding this medication to see if it helps with her bilateral knee pain.  She reports a prior intolerance to rosuvastatin.  She will contact our office and let us know if she has any improvement in knee pain with the discontinuation of Lipitor.  If her knee pain persists despite holding Lipitor we will plan to have her continue atorvastatin.  However, if knee pain improves we will have her hold off on restarting high intensity statin and place her on Zetia.    4. Tobacco use: Complete cessation is advised.  Disposition: F/u with Dr. Rockey Situ or an APP in 12 months, sooner if needed.   Medication Adjustments/Labs and Tests Ordered: Current medicines are reviewed at length with the patient today.  Concerns regarding medicines are outlined above. Medication changes, Labs and Tests ordered today are summarized above and listed in  the Patient Instructions accessible in Encounters.   Signed, Christell Faith, PA-C 06/01/2020 1:52 PM     [address] [place],  [address] [phone]

## 2020-06-01 ENCOUNTER — Other Ambulatory Visit: Payer: Self-pay

## 2020-06-01 ENCOUNTER — Ambulatory Visit (INDEPENDENT_AMBULATORY_CARE_PROVIDER_SITE_OTHER): Payer: Medicare HMO | Admitting: Physician Assistant

## 2020-06-01 ENCOUNTER — Encounter: Payer: Self-pay | Admitting: Physician Assistant

## 2020-06-01 VITALS — BP 150/90 | HR 61 | Ht 67.0 in | Wt 154.0 lb

## 2020-06-01 DIAGNOSIS — Z720 Tobacco use: Secondary | ICD-10-CM | POA: Diagnosis not present

## 2020-06-01 DIAGNOSIS — E785 Hyperlipidemia, unspecified: Secondary | ICD-10-CM

## 2020-06-01 DIAGNOSIS — I2510 Atherosclerotic heart disease of native coronary artery without angina pectoris: Secondary | ICD-10-CM

## 2020-06-01 DIAGNOSIS — I10 Essential (primary) hypertension: Secondary | ICD-10-CM

## 2020-06-01 MED ORDER — LOSARTAN POTASSIUM-HCTZ 100-25 MG PO TABS: 1.0000 | ORAL_TABLET | Freq: Every day | ORAL | 3 refills | Status: DC

## 2020-06-01 MED ORDER — CARVEDILOL 25 MG PO TABS: 25.0000 mg | ORAL_TABLET | Freq: Two times a day (BID) | ORAL | 0 refills | Status: DC

## 2020-06-01 MED ORDER — ISOSORBIDE MONONITRATE ER 30 MG PO TB24: 30.0000 mg | ORAL_TABLET | Freq: Every day | ORAL | 3 refills | Status: DC

## 2020-06-01 NOTE — Patient Instructions (Signed)
Medication Instructions:  1- HOLD lipitor to see if sx improve *If you need a refill on your cardiac medications before your next appointment, please call your pharmacy*   Lab Work: None ordered If you have labs (blood work) drawn today and your tests are completely normal, you will receive your results only by: Marland Kitchen MyChart Message (if you have MyChart) OR . A paper copy in the mail If you have any lab test that is abnormal or we need to change your treatment, we will call you to review the results.   Testing/Procedures: None ordered   Follow-Up: At [facility], you and your health needs are our priority.  As part of our continuing mission to provide you with exceptional heart care, we have created designated Provider Care Teams.  These Care Teams include your primary Cardiologist (physician) and Advanced Practice Providers (APPs -  Physician Assistants and Nurse Practitioners) who all work together to provide you with the care you need, when you need it.  We recommend signing up for the patient portal called "MyChart".  Sign up information is provided on this After Visit Summary.  MyChart is used to connect with patients for Virtual Visits (Telemedicine).  Patients are able to view lab/test results, encounter notes, upcoming appointments, etc.  Non-urgent messages can be sent to your provider as well.   To learn more about what you can do with MyChart, go to [email or web address].    Your next appointment:   12 month(s)  The format for your next appointment:   In Person  Provider:    You may see Ida Rogue, MD or Christell Faith, PA-

## 2020-06-04 ENCOUNTER — Telehealth: Payer: Self-pay

## 2020-06-04 MED ORDER — ATORVASTATIN CALCIUM 40 MG PO TABS: 40.0000 mg | ORAL_TABLET | Freq: Every day | ORAL | 3 refills | Status: DC

## 2020-06-04 NOTE — Telephone Encounter (Signed)
-----   Message from Rise Mu, PA-C sent at 06/02/2020  1:03 PM EDT ----- LDL is improved, though slightly above goal of < 70 at 78. Liver function is improved and normal. She planned to hold Lipitor for several days to see if this helped with her knee pain. If holding this medication leads to improvement in knee pain, please have her let us know so we can consider PCSK9 inhibitor or Zetia. If her knee pain persists despite holding Lipitor, I would like for her to resume the medication and continue to work on a heart healthy diet.

## 2020-06-04 NOTE — Telephone Encounter (Signed)
Call to patient to review labs.    Spoke to daughter who verbalized understanding and has no further questions at this time.    Advised pt to call for any further questions or concerns.  Knee pain did not improve while holding. She will restart atorvastatin. rx updated.

## 2020-06-04 NOTE — Telephone Encounter (Signed)
Attempted to call patient. LMTCB 06/04/2020

## 2020-10-24 DIAGNOSIS — I10 Essential (primary) hypertension: Secondary | ICD-10-CM | POA: Diagnosis not present

## 2020-10-24 DIAGNOSIS — K219 Gastro-esophageal reflux disease without esophagitis: Secondary | ICD-10-CM | POA: Diagnosis not present

## 2020-11-24 DIAGNOSIS — I10 Essential (primary) hypertension: Secondary | ICD-10-CM | POA: Diagnosis not present

## 2020-11-24 DIAGNOSIS — I2510 Atherosclerotic heart disease of native coronary artery without angina pectoris: Secondary | ICD-10-CM | POA: Diagnosis not present

## 2020-12-22 DIAGNOSIS — I2510 Atherosclerotic heart disease of native coronary artery without angina pectoris: Secondary | ICD-10-CM | POA: Diagnosis not present

## 2020-12-22 DIAGNOSIS — I10 Essential (primary) hypertension: Secondary | ICD-10-CM | POA: Diagnosis not present

## 2021-01-28 ENCOUNTER — Other Ambulatory Visit (HOSPITAL_COMMUNITY): Payer: Self-pay | Admitting: Internal Medicine

## 2021-01-28 DIAGNOSIS — Z1331 Encounter for screening for depression: Secondary | ICD-10-CM | POA: Diagnosis not present

## 2021-01-28 DIAGNOSIS — Z1389 Encounter for screening for other disorder: Secondary | ICD-10-CM | POA: Diagnosis not present

## 2021-01-28 DIAGNOSIS — F17210 Nicotine dependence, cigarettes, uncomplicated: Secondary | ICD-10-CM | POA: Diagnosis not present

## 2021-01-28 DIAGNOSIS — K219 Gastro-esophageal reflux disease without esophagitis: Secondary | ICD-10-CM | POA: Diagnosis not present

## 2021-01-28 DIAGNOSIS — R69 Illness, unspecified: Secondary | ICD-10-CM | POA: Diagnosis not present

## 2021-01-28 DIAGNOSIS — Z0001 Encounter for general adult medical examination with abnormal findings: Secondary | ICD-10-CM | POA: Diagnosis not present

## 2021-01-28 DIAGNOSIS — I2510 Atherosclerotic heart disease of native coronary artery without angina pectoris: Secondary | ICD-10-CM | POA: Diagnosis not present

## 2021-01-28 DIAGNOSIS — I10 Essential (primary) hypertension: Secondary | ICD-10-CM | POA: Diagnosis not present

## 2021-01-28 DIAGNOSIS — Z1231 Encounter for screening mammogram for malignant neoplasm of breast: Secondary | ICD-10-CM

## 2021-01-29 DIAGNOSIS — Z79899 Other long term (current) drug therapy: Secondary | ICD-10-CM | POA: Diagnosis not present

## 2021-01-29 DIAGNOSIS — Z0001 Encounter for general adult medical examination with abnormal findings: Secondary | ICD-10-CM | POA: Diagnosis not present

## 2021-01-29 DIAGNOSIS — E559 Vitamin D deficiency, unspecified: Secondary | ICD-10-CM | POA: Diagnosis not present

## 2021-01-29 DIAGNOSIS — I10 Essential (primary) hypertension: Secondary | ICD-10-CM | POA: Diagnosis not present

## 2021-02-18 ENCOUNTER — Ambulatory Visit (HOSPITAL_COMMUNITY)
Admission: RE | Admit: 2021-02-18 | Discharge: 2021-02-18 | Disposition: A | Discharge status: Home / Self Care | Payer: Medicare HMO | Source: Ambulatory Visit | Attending: Internal Medicine | Admitting: Internal Medicine

## 2021-02-18 DIAGNOSIS — Z1231 Encounter for screening mammogram for malignant neoplasm of breast: Secondary | ICD-10-CM | POA: Diagnosis not present

## 2021-02-18 DIAGNOSIS — K219 Gastro-esophageal reflux disease without esophagitis: Secondary | ICD-10-CM | POA: Diagnosis not present

## 2021-02-18 DIAGNOSIS — I10 Essential (primary) hypertension: Secondary | ICD-10-CM | POA: Diagnosis not present

## 2021-02-18 DIAGNOSIS — M79645 Pain in left finger(s): Secondary | ICD-10-CM | POA: Diagnosis not present

## 2021-03-02 ENCOUNTER — Ambulatory Visit (INDEPENDENT_AMBULATORY_CARE_PROVIDER_SITE_OTHER): Payer: Medicare HMO | Admitting: *Deleted

## 2021-03-02 ENCOUNTER — Other Ambulatory Visit: Payer: Self-pay

## 2021-03-02 ENCOUNTER — Other Ambulatory Visit: Payer: Self-pay | Admitting: Gastroenterology

## 2021-03-02 VITALS — Ht 67.0 in | Wt 139.8 lb

## 2021-03-02 DIAGNOSIS — Z86010 Personal history of colonic polyps: Secondary | ICD-10-CM

## 2021-03-02 MED ORDER — NA SULFATE-K SULFATE-MG SULF 17.5-3.13-1.6 GM/177ML PO SOLN: 1.0000 | Freq: Once | ORAL | 0 refills | Status: AC

## 2021-03-02 NOTE — Progress Notes (Signed)
Gastroenterology Pre-Procedure Review  Request Date: 03/02/2021 Requesting Physician: Dr. Legrand Rams, Last TCS 06/08/2018 done by Dr. 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 Diabetes Melitis: no 2. Joint replacements in the past 12 months: no 3. Major health problems in the past 3 months: no 4. Has an artificial valve or MVP: no 5. Has a defibrillator: no 6. Has been advised in past to take antibiotics in advance of a procedure like teeth cleaning: no 7. Family history of colon cancer: no 8. Alcohol Use: yes, 2 mixed drinks a month 9. Illicit drug Use: yes, daily marijuana for pain 10. History of sleep apnea: no 11. History of coronary artery or other vascular stents placed within the last 12 months: no 12. History of any prior anesthesia complications: no 13. Body mass index is 21.9 kg/m.    MEDICATIONS & ALLERGIES:    Patient reports the following regarding taking any blood thinners:   Plavix? no Aspirin? yes, 81 mg Coumadin? no Brilinta? no Xarelto? no Eliquis? no Pradaxa? no Savaysa? no Effient? no  Patient confirms/reports the following medications:  Current Outpatient Medications  Medication Sig Dispense Refill  . aspirin 81 MG tablet Take 162 mg by mouth at bedtime.     Marland Kitchen atorvastatin (LIPITOR) 40 MG tablet Take 1 tablet (40 mg total) by mouth daily. 90 tablet 3  . carvedilol (COREG) 25 MG tablet Take 1 tablet (25 mg total) by mouth 2 (two) times daily with a meal. 180 tablet 0  . cholecalciferol (VITAMIN D) 1000 UNITS tablet Take 1,000 Units by mouth daily.     . Cyanocobalamin (VITAMIN B-12 PO) Take by mouth daily. Takes 2 gummies daily.    . isosorbide mononitrate (IMDUR) 30 MG 24 hr tablet Take 1 tablet (30 mg total) by mouth daily. 90 tablet 3  . losartan-hydrochlorothiazide (HYZAAR) 100-25 MG tablet Take 1 tablet by mouth daily. 90 tablet 3  .  nitroGLYCERIN (NITROSTAT) 0.4 MG SL tablet DISSOLVE ONE TABLET UNDER THE TONGUE EVERY 5 MINUTES AS NEEDED FOR CHEST PAIN.  DO NOT EXCEED A TOTAL OF 3 DOSES IN 15 MINUTES (Patient taking differently: as needed.) 25 tablet 0  . pantoprazole (PROTONIX) 40 MG tablet Take 1 tablet (40 mg total) by mouth daily. 90 tablet 3   No current facility-administered medications for this visit.    Patient confirms/reports the following allergies:  Allergies  Allergen Reactions  . Amlodipine Other (See Comments)    Patient does not recall  . Atorvastatin Other (See Comments)    Pain in knees  . Clopidogrel Bisulfate Other (See Comments)    Other Reaction: blood too thin-had stroke  . Linzess [Linaclotide] Rash    No orders of the defined types were placed in this encounter.   AUTHORIZATION INFORMATION Primary Insurance: [facility],  ID #: [id number],  Group #: [id number] [place] Pre-Cert / Josem Kaufmann required: No, not required  SCHEDULE INFORMATION: Procedure has been scheduled as follows:  Date: 03/29/2021, Time: 12:30  Location: [facility] with Dr. Abbey Chatters  This Gastroenterology Pre-Precedure Review Form is being routed to the following provider(s): Roseanne Kaufman, NP

## 2021-03-02 NOTE — Patient Instructions (Addendum)
[place]   Patient Name:  JANARA KLETT Date of procedure: 04/27/2021 Time to register at [facility]:  8:00 AM Provider:  Dr. Abbey Chatters  Please notify us immediately if you are diabetic, take iron supplements, or if you are on coumadin or any blood thinners.  Please hold the following medications:   Note: Do NOT refrigerate or freeze CLENPIQ. CLENPIQ is ready to drink. There is no need to add any other liquid or mix the medicine in the bottle before you start dosing.   04/26/2021- 1 Day prior to procedure:    CLEAR LIQUIDS ALL DAY--NO SOLID FOODS OR DAIRY PRODUCTS! See list of liquids that are allowed and items that are NOT allowed below.  Diabetic Medication Instructions:    You must drink plenty of CLEAR LIQUIDS starting before your bowel prep. It is important to stay adequately hydrated before, during, and after your bowel prep for the prep to work effectively!  At 4:00 PM Begin the prep as follows:    Drink one bottle of pre-mixed CLENPIQ right from the bottle.  Drink at least five (5) 8-ounce drinks of clear liquids of your choice within the next 5 hours  At 10:00 PM: Drink the second bottle of pre-mixed CLENPIQ right from the bottle.   Drink at least three (3) 8-ounce drinks of clear liquids of your choice within the next 3 hours before going to bed.  Continue clear liquids.     04/27/2021- Day of Procedure:  Diabetic medications adjustments:   You may take TYLENOL products.  Please continue your regular medications unless we have instructed you otherwise.    At 3 hours before procedure @ 6:30 am: Stop drinking all liquids, nothing by mouth at this point.   Please note, on the day of your procedure you MUST be accompanied by an adult who is willing to assume responsibility for you at time of discharge. If you do not have such person with you, your procedure will have to be rescheduled.                                                                                                                      Please leave ALL jewelry at home prior to coming to the hospital for your procedure.   *It is your responsibility to check with your insurance company for the benefits of coverage you have for this procedure. Unfortunately, not all insurance companies have benefits to cover all or part of these types of procedures. It is your responsibility to check your benefits, however we will be glad to assist you with any codes your insurance company may need.   Please note that most insurance companies will not cover a screening colonoscopy for people under the age of 45  For example, with some insurance companies you may have benefits for a screening colonoscopy, but if polyps are found the diagnosis will change and then you may have a deductible that will need to be met. Please make  sure you check your benefits for screening colonoscopy as well as a diagnostic colonoscopy.    CLEAR LIQUIDS: (NO RED or PURPLE) Water  Jello   Apple Juice  White Grape Juice   Kool-Aid Soft drinks  Banana popsicles Sports Drink  Black coffee (No cream or milk) Tea (No cream or milk)  Broth (fat free beef/chicken/vegetable)  Clear liquids allow you to see your fingers on the other side of the glass.  Be sure they are NOT RED or PURPLE in color, cloudy, but CLEAR.  Do Not Eat: Dairy products of any kind Cranberry juice Tomato or V8 Juice  Orange Juice   Grapefruit Juice Red Grape Juice Alcohol   Non-dairy creamer Solid foods like cereal, oatmeal, yogurt, fruits, vegetables, creamed soups, eggs, bread, etc   HELPFUL HINTS TO MAKE DRINKING EASIER: -Trying drinking through a straw. -If you become nauseated, try consuming smaller amounts or stretch out the time between glasses.  Stop for 30 minutes & slowly start back drinking.  Call our office with any questions or concerns at [phone].  Thank You,  Christ Kick, [place]

## 2021-03-05 NOTE — Telephone Encounter (Signed)
Pt is aware that I will give her a Clenpiq kit.

## 2021-03-05 NOTE — Progress Notes (Signed)
Received response from pharmacy that [place] is too expensive for pt.  Called pt and made her aware that I have a Clenpiq kit here that she can pick up from office.

## 2021-03-08 NOTE — Progress Notes (Signed)
ASA 2 appropriate.

## 2021-03-11 ENCOUNTER — Other Ambulatory Visit: Payer: Self-pay | Admitting: *Deleted

## 2021-03-11 ENCOUNTER — Encounter: Payer: Self-pay | Admitting: *Deleted

## 2021-03-11 DIAGNOSIS — Z139 Encounter for screening, unspecified: Secondary | ICD-10-CM

## 2021-03-11 NOTE — Addendum Note (Signed)
Addended by: Metro Kung on: 03/11/2021 11:59 AM   Modules accepted: Orders

## 2021-03-11 NOTE — Progress Notes (Signed)
Spoke to [place] (daughter listed on [place]).  She rescheduled pt's procedure from 03/29/2021 to 04/27/2021 at 9:30, arrival: 8:00.  She was made aware that pt will need labs drawn on 04/23/2021.  Pt will pick up new instructions along with prep kit from our office closer to procedure date.

## 2021-03-21 DIAGNOSIS — I2510 Atherosclerotic heart disease of native coronary artery without angina pectoris: Secondary | ICD-10-CM | POA: Diagnosis not present

## 2021-03-21 DIAGNOSIS — I10 Essential (primary) hypertension: Secondary | ICD-10-CM | POA: Diagnosis not present

## 2021-03-31 ENCOUNTER — Other Ambulatory Visit (HOSPITAL_COMMUNITY): Payer: Self-pay | Admitting: Gerontology

## 2021-03-31 ENCOUNTER — Other Ambulatory Visit: Payer: Self-pay

## 2021-03-31 ENCOUNTER — Ambulatory Visit (HOSPITAL_COMMUNITY)
Admission: RE | Admit: 2021-03-31 | Discharge: 2021-03-31 | Disposition: A | Discharge status: Home / Self Care | Payer: Medicare HMO | Source: Ambulatory Visit | Attending: Gerontology | Admitting: Gerontology

## 2021-03-31 DIAGNOSIS — M79642 Pain in left hand: Secondary | ICD-10-CM

## 2021-04-07 ENCOUNTER — Other Ambulatory Visit: Payer: Self-pay | Admitting: Physician Assistant

## 2021-04-12 NOTE — Progress Notes (Signed)
Pt came by and picked up prep kit and instructions today.

## 2021-04-14 ENCOUNTER — Other Ambulatory Visit (HOSPITAL_COMMUNITY)
Admission: RE | Admit: 2021-04-14 | Discharge: 2021-04-14 | Disposition: A | Discharge status: Home / Self Care | Payer: Medicare HMO | Source: Ambulatory Visit | Attending: Internal Medicine | Admitting: Internal Medicine

## 2021-04-14 DIAGNOSIS — Z139 Encounter for screening, unspecified: Secondary | ICD-10-CM | POA: Diagnosis not present

## 2021-04-14 LAB — BASIC METABOLIC PANEL
Anion gap: 7 (ref 5–15)
BUN: 13 mg/dL (ref 8–23)
CO2: 28 mmol/L (ref 22–32)
Calcium: 8.9 mg/dL (ref 8.9–10.3)
Chloride: 104 mmol/L (ref 98–111)
Creatinine, Ser: 0.94 mg/dL (ref 0.44–1.00)
GFR, Estimated: >60 mL/min (ref >60)
Glucose, Bld: 94 mg/dL (ref 70–99)
Potassium: 3.9 mmol/L (ref 3.5–5.1)
Sodium: 139 mmol/L (ref 135–145)

## 2021-04-20 DIAGNOSIS — I10 Essential (primary) hypertension: Secondary | ICD-10-CM | POA: Diagnosis not present

## 2021-04-20 DIAGNOSIS — I2510 Atherosclerotic heart disease of native coronary artery without angina pectoris: Secondary | ICD-10-CM | POA: Diagnosis not present

## 2021-04-27 ENCOUNTER — Other Ambulatory Visit: Payer: Self-pay

## 2021-04-27 ENCOUNTER — Ambulatory Visit (HOSPITAL_COMMUNITY): Payer: Medicare HMO | Admitting: Anesthesiology

## 2021-04-27 ENCOUNTER — Ambulatory Visit (HOSPITAL_COMMUNITY)
Admission: RE | Admit: 2021-04-27 | Discharge: 2021-04-27 | Disposition: A | Discharge status: Home / Self Care | Payer: Medicare HMO | Attending: Internal Medicine | Admitting: Internal Medicine

## 2021-04-27 ENCOUNTER — Encounter (HOSPITAL_COMMUNITY): Payer: Self-pay

## 2021-04-27 ENCOUNTER — Encounter (HOSPITAL_COMMUNITY)
Admission: RE | Disposition: A | Discharge status: Home / Self Care | Payer: Self-pay | Source: Home / Self Care | Attending: Internal Medicine

## 2021-04-27 DIAGNOSIS — K635 Polyp of colon: Secondary | ICD-10-CM | POA: Diagnosis not present

## 2021-04-27 DIAGNOSIS — D123 Benign neoplasm of transverse colon: Secondary | ICD-10-CM | POA: Diagnosis not present

## 2021-04-27 DIAGNOSIS — Z1211 Encounter for screening for malignant neoplasm of colon: Secondary | ICD-10-CM | POA: Diagnosis not present

## 2021-04-27 DIAGNOSIS — D122 Benign neoplasm of ascending colon: Secondary | ICD-10-CM | POA: Insufficient documentation

## 2021-04-27 DIAGNOSIS — Z79899 Other long term (current) drug therapy: Secondary | ICD-10-CM | POA: Insufficient documentation

## 2021-04-27 DIAGNOSIS — Z7982 Long term (current) use of aspirin: Secondary | ICD-10-CM | POA: Insufficient documentation

## 2021-04-27 DIAGNOSIS — Z86010 Personal history of colonic polyps: Secondary | ICD-10-CM | POA: Diagnosis not present

## 2021-04-27 DIAGNOSIS — I2510 Atherosclerotic heart disease of native coronary artery without angina pectoris: Secondary | ICD-10-CM | POA: Diagnosis not present

## 2021-04-27 DIAGNOSIS — F17210 Nicotine dependence, cigarettes, uncomplicated: Secondary | ICD-10-CM | POA: Insufficient documentation

## 2021-04-27 DIAGNOSIS — K5730 Diverticulosis of large intestine without perforation or abscess without bleeding: Secondary | ICD-10-CM | POA: Diagnosis not present

## 2021-04-27 DIAGNOSIS — Z888 Allergy status to other drugs, medicaments and biological substances status: Secondary | ICD-10-CM | POA: Diagnosis not present

## 2021-04-27 DIAGNOSIS — Z8249 Family history of ischemic heart disease and other diseases of the circulatory system: Secondary | ICD-10-CM | POA: Diagnosis not present

## 2021-04-27 DIAGNOSIS — D120 Benign neoplasm of cecum: Secondary | ICD-10-CM | POA: Diagnosis not present

## 2021-04-27 DIAGNOSIS — Z8261 Family history of arthritis: Secondary | ICD-10-CM | POA: Diagnosis not present

## 2021-04-27 DIAGNOSIS — K648 Other hemorrhoids: Secondary | ICD-10-CM | POA: Diagnosis not present

## 2021-04-27 DIAGNOSIS — Z8042 Family history of malignant neoplasm of prostate: Secondary | ICD-10-CM | POA: Diagnosis not present

## 2021-04-27 HISTORY — PX: POLYPECTOMY: SHX5525

## 2021-04-27 HISTORY — PX: COLONOSCOPY WITH PROPOFOL: SHX5780

## 2021-04-27 SURGERY — COLONOSCOPY WITH PROPOFOL
Anesthesia: General

## 2021-04-27 MED ORDER — LACTATED RINGERS IV SOLN: INTRAVENOUS | Status: DC

## 2021-04-27 MED ORDER — PHENYLEPHRINE HCL (PRESSORS) 10 MG/ML IV SOLN
INTRAVENOUS | Status: DC | PRN
  Administered 2021-04-27: 80 ug via INTRAVENOUS
  Administered 2021-04-27: 40 ug via INTRAVENOUS

## 2021-04-27 MED ORDER — PROPOFOL 10 MG/ML IV BOLUS
INTRAVENOUS | Status: DC | PRN
  Administered 2021-04-27: 50 mg via INTRAVENOUS
  Administered 2021-04-27: 100 mg via INTRAVENOUS
  Administered 2021-04-27 (×3): 50 mg via INTRAVENOUS

## 2021-04-27 NOTE — Anesthesia Preprocedure Evaluation (Signed)
Anesthesia Evaluation  Patient identified by MRN, date of birth, ID band Patient awake    Reviewed: Allergy & Precautions, H&P , NPO status , Patient's Chart, lab work & pertinent test results, reviewed documented beta blocker date and time   Airway Mallampati: II  TM Distance: >3 FB Neck ROM: full    Dental no notable dental hx.    Pulmonary COPD, Current Smoker and Patient abstained from smoking.,    Pulmonary exam normal breath sounds clear to auscultation       Cardiovascular Exercise Tolerance: Good hypertension, + CAD and + Past MI   Rhythm:regular Rate:Normal     Neuro/Psych Anxiety TIA Neuromuscular disease negative psych ROS   GI/Hepatic Neg liver ROS, GERD  Medicated,  Endo/Other  negative endocrine ROS  Renal/GU negative Renal ROS  negative genitourinary   Musculoskeletal   Abdominal   Peds  Hematology negative hematology ROS (+)   Anesthesia Other Findings   Reproductive/Obstetrics negative OB ROS                             Anesthesia Physical Anesthesia Plan  ASA: 3  Anesthesia Plan: General   Post-op Pain Management:    Induction:   PONV Risk Score and Plan: Propofol infusion  Airway Management Planned:   Additional Equipment:   Intra-op Plan:   Post-operative Plan:   Informed Consent: I have reviewed the patients History and Physical, chart, labs and discussed the procedure including the risks, benefits and alternatives for the proposed anesthesia with the patient or authorized representative who has indicated his/her understanding and acceptance.     Dental Advisory Given  Plan Discussed with: CRNA  Anesthesia Plan Comments:         Anesthesia Quick Evaluation

## 2021-04-27 NOTE — Discharge Instructions (Addendum)
  Colonoscopy Discharge Instructions  Read the instructions outlined below and refer to this sheet in the next few weeks. These discharge instructions provide you with general information on caring for yourself after you leave the hospital. Your doctor may also give you specific instructions. While your treatment has been planned according to the most current medical practices available, unavoidable complications occasionally occur.   ACTIVITY You may resume your regular activity, but move at a slower pace for the next 24 hours.  Take frequent rest periods for the next 24 hours.  Walking will help get rid of the air and reduce the bloated feeling in your belly (abdomen).  No driving for 24 hours (because of the medicine (anesthesia) used during the test).   Do not sign any important legal documents or operate any machinery for 24 hours (because of the anesthesia used during the test).  NUTRITION Drink plenty of fluids.  You may resume your normal diet as instructed by your doctor.  Begin with a light meal and progress to your normal diet. Heavy or fried foods are harder to digest and may make you feel sick to your stomach (nauseated).  Avoid alcoholic beverages for 24 hours or as instructed.  MEDICATIONS You may resume your normal medications unless your doctor tells you otherwise.  WHAT YOU CAN EXPECT TODAY Some feelings of bloating in the abdomen.  Passage of more gas than usual.  Spotting of blood in your stool or on the toilet paper.  IF YOU HAD POLYPS REMOVED DURING THE COLONOSCOPY: No aspirin products for 7 days or as instructed.  No alcohol for 7 days or as instructed.  Eat a soft diet for the next 24 hours.  FINDING OUT THE RESULTS OF YOUR TEST Not all test results are available during your visit. If your test results are not back during the visit, make an appointment with your caregiver to find out the results. Do not assume everything is normal if you have not heard from your  caregiver or the medical facility. It is important for you to follow up on all of your test results.  SEEK IMMEDIATE MEDICAL ATTENTION IF: You have more than a spotting of blood in your stool.  Your belly is swollen (abdominal distention).  You are nauseated or vomiting.  You have a temperature over 101.  You have abdominal pain or discomfort that is severe or gets worse throughout the day.   Your colonoscopy revealed 8 polyp(s) which I removed successfully. Await pathology results, my office will contact you. I recommend repeating colonoscopy in 6 months as the colon preparation was not ideal.  I cannot rule out further polyps in the right side your colon as I was unable to see completely.  Follow-up with GI in 3 months to further discuss.  I hope you have a great rest of your week!  Elon Alas. Abbey Chatters, D.O. Gastroenterology and Hepatology [facility]

## 2021-04-27 NOTE — Transfer of Care (Signed)
Immediate Anesthesia Transfer of Care Note  Patient: Sophia Briggs  Procedure(s) Performed: COLONOSCOPY WITH PROPOFOL POLYPECTOMY  Patient Location: Endoscopy Unit  Anesthesia Type:General  Level of Consciousness: drowsy  Airway & Oxygen Therapy: Patient Spontanous Breathing  Post-op Assessment: Report given to RN and Post -op Vital signs reviewed and stable  Post vital signs: Reviewed and stable  Last Vitals:  Vitals Value Taken Time  BP    Temp    Pulse 62 04/27/21  1009  Resp    SpO2 97 04/27/21  1009    Last Pain:  Vitals:   04/27/21 0817  TempSrc: Oral  PainSc: 0-No pain      Patients Stated Pain Goal: 6 ([id number])  Complications: No notable events documented.

## 2021-04-27 NOTE — Op Note (Signed)
[facility] Patient Name: Sophia Briggs Procedure Date: 04/27/2021 9:32 AM MRN: [id number] Date of Birth: November 09, 1945 Attending MD: Elon Alas. Abbey Chatters DO CSN: [id number] Age: 75 Admit Type: Outpatient Procedure:                Colonoscopy Indications:              Surveillance: Personal history of adenomatous                            polyps on last colonoscopy 3 years ago Providers:                Elon Alas. Abbey Chatters, DO, Janeece Riggers, RN, Randa Spike, Technician Referring MD:              Medicines:                See the Anesthesia note for documentation of the                            administered medications Complications:            No immediate complications. Estimated Blood Loss:     Estimated blood loss was minimal. Procedure:                Pre-Anesthesia Assessment:                           - The anesthesia plan was to use monitored                            anesthesia care (MAC).                           After obtaining informed consent, the colonoscope                            was passed under direct vision. Throughout the                            procedure, the patient's blood pressure, pulse, and                            oxygen saturations were monitored continuously. The                            PCF-HQ190L ([id number]) scope was introduced through                            the anus and advanced to the the cecum, identified                            by appendiceal orifice and ileocecal valve. The                            colonoscopy was performed without difficulty.  The                            patient tolerated the procedure well. The quality                            of the bowel preparation was evaluated using the                            BBPS [facility] Bowel Preparation Scale) with scores                            of: Right Colon = 1 (portion of mucosa seen, but                            other areas not well  seen due to staining, residual                            stool and/or opaque liquid), Transverse Colon = 2                            (minor amount of residual staining, small fragments                            of stool and/or opaque liquid, but mucosa seen                            well) and Left Colon = 2 (minor amount of residual                            staining, small fragments of stool and/or opaque                            liquid, but mucosa seen well). The total BBPS score                            equals 5. The quality of the bowel preparation was                            inadequate. Scope In: 9:47:11 AM Scope Out: 10:06:26 AM Scope Withdrawal Time: 0 hours 13 minutes 29 seconds  Total Procedure Duration: 0 hours 19 minutes 15 seconds  Findings:      The perianal and digital rectal examinations were normal.      Non-bleeding internal hemorrhoids were found during endoscopy.      Multiple small and large-mouthed diverticula were found in the sigmoid       colon and descending colon.      Four sessile polyps were found in the ascending colon and cecum. The       polyps were 3 to 8 mm in size. These polyps were removed with a cold       snare. Resection and retrieval were complete.      Four sessile polyps were found in the transverse colon. The polyps were  4 to 7 mm in size. These polyps were removed with a cold snare.       Resection and retrieval were complete.      Extensive amounts of stool was found in the transverse colon, in the       ascending colon and in the cecum, precluding visualization. Lavage of       the area was performed using copious amounts of sterile water, resulting       in incomplete clearance with continued poor visualization. Impression:               - Preparation of the colon was inadequate.                           - Non-bleeding internal hemorrhoids.                           - Diverticulosis in the sigmoid colon and in the                             descending colon.                           - Four 3 to 8 mm polyps in the ascending colon and                            in the cecum, removed with a cold snare. Resected                            and retrieved.                           - Four 4 to 7 mm polyps in the transverse colon,                            removed with a cold snare. Resected and retrieved.                           - Stool in the transverse colon, in the ascending                            colon and in the cecum. Moderate Sedation:      Per Anesthesia Care Recommendation:           - Patient has a contact number available for                            emergencies. The signs and symptoms of potential                            delayed complications were discussed with the                            patient. Return to normal activities tomorrow.  Written discharge instructions were provided to the                            patient.                           - Resume previous diet.                           - Continue present medications.                           - Await pathology results.                           - Repeat colonoscopy in 6 months because the bowel                            preparation was suboptimal.                           - Return to GI clinic in 3 months. Procedure Code(s):        --- Professional ---                           [phone], Colonoscopy, flexible; with removal of                            tumor(s), polyp(s), or other lesion(s) by snare                            technique Diagnosis Code(s):        --- Professional ---                           Z86.010, Personal history of colonic polyps                           K64.8, Other hemorrhoids                           K63.5, Polyp of colon                           K57.30, Diverticulosis of large intestine without                            perforation or abscess without bleeding CPT  copyright 2019 American Medical Association. All rights reserved. The codes documented in this report are preliminary and upon coder review may  be revised to meet current compliance requirements. Elon Alas. Abbey Chatters, DO [place] Abbey Chatters, DO 04/27/2021 10:10:09 AM This report has been signed electronically. Number of Addenda: 0

## 2021-04-27 NOTE — Anesthesia Postprocedure Evaluation (Signed)
Anesthesia Post Note  Patient: Sophia Briggs  Procedure(s) Performed: COLONOSCOPY WITH PROPOFOL POLYPECTOMY  Patient location during evaluation: Phase II Anesthesia Type: General Level of consciousness: awake Pain management: pain level controlled Vital Signs Assessment: post-procedure vital signs reviewed and stable Respiratory status: spontaneous breathing and respiratory function stable Cardiovascular status: blood pressure returned to baseline and stable Postop Assessment: no headache and no apparent nausea or vomiting Anesthetic complications: no Comments: Late entry   No notable events documented.   Last Vitals:  Vitals:   04/27/21 0817 04/27/21 1010  BP: (!) 179/87 133/75  Pulse:  66  Resp: 12 18  Temp: 36.5 C 36.6 C  SpO2: 100% 98%    Last Pain:  Vitals:   04/27/21 1010  TempSrc: Oral  PainSc: 0-No pain                 Louann Sjogren

## 2021-04-27 NOTE — H&P (Addendum)
Primary Care Physician:  Rosita Fire, MD Primary Gastroenterologist:  Dr. Abbey Chatters  Pre-Procedure History & Physical: HPI:  Sophia Briggs is a 75 y.o. female is here for a colonoscopy to be performed for surveillance due to personal history of adenomatous colon polyps. Last TCS 3 years ago, 31 tubular adenomas removed.   Past Medical History:  Diagnosis Date   Acute MI [facility])    Allergy    Anxiety    Arthritis    all over   Back pain    COPD (chronic obstructive pulmonary disease) (HCC)    Coronary artery disease    CTS (carpal tunnel syndrome) 01/06/2012   GERD (gastroesophageal reflux disease)    Hyperlipidemia    Hypertension    Stroke [facility])    Syncope and collapse    TIA (transient ischemic attack)     Past Surgical History:  Procedure Laterality Date   CARDIAC CATHETERIZATION     stent placement 2006 at [facility]    COLONOSCOPY N/A 06/08/2018   Procedure: COLONOSCOPY;  Surgeon: Danie Binder, MD;  Location: [facility] ENDO SUITE;  Service: Endoscopy;  Laterality: N/A;  12:15pm   PAROTIDECTOMY     POLYPECTOMY  06/08/2018   Procedure: POLYPECTOMY;  Surgeon: Danie Binder, MD;  Location: [facility] ENDO SUITE;  Service: Endoscopy;;  cecal polyps times 6 ( 4cs, 2 hs), ascending colon polyps ( 2cs, 2hs) hepatic flexure polyps (4 cs, 5 hs), transverse colon polyps  splenic flexure polyp times 2 cs, 2 cb   TUBAL LIGATION      Prior to Admission medications   Medication Sig Start Date End Date Taking? Authorizing Provider  aspirin 81 MG tablet Take 162 mg by mouth at bedtime.  09/17/08  Yes [provider]  atorvastatin (LIPITOR) 40 MG tablet Take 40 mg by mouth daily.   Yes [provider]  carvedilol (COREG) 25 MG tablet TAKE 1 TABLET BY MOUTH TWICE DAILY WITH A MEAL Patient taking differently: Take 25 mg by mouth 2 (two) times daily with a meal. 04/07/21  Yes Dunn, Areta Haber, PA-C  cholecalciferol (VITAMIN D) 1000 UNITS tablet Take 1,000 Units by mouth daily.     Yes [provider]  hydrOXYzine (ATARAX/VISTARIL) 25 MG tablet Take 25 mg by mouth 3 (three) times daily as needed for anxiety.   Yes [provider]  isosorbide mononitrate (IMDUR) 30 MG 24 hr tablet Take 1 tablet (30 mg total) by mouth daily. 06/01/20  Yes Dunn, Areta Haber, PA-C  losartan-hydrochlorothiazide (HYZAAR) 100-25 MG tablet Take 1 tablet by mouth daily. 06/01/20  Yes Dunn, Areta Haber, PA-C  nitroGLYCERIN (NITROSTAT) 0.4 MG SL tablet DISSOLVE ONE TABLET UNDER THE TONGUE EVERY 5 MINUTES AS NEEDED FOR CHEST PAIN.  DO NOT EXCEED A TOTAL OF 3 DOSES IN 15 MINUTES Patient taking differently: Place 0.4 mg under the tongue every 5 (five) minutes as needed for chest pain. 07/03/19  Yes Gollan, Kathlene November, MD  pantoprazole (PROTONIX) 40 MG tablet Take 1 tablet (40 mg total) by mouth daily. 07/07/17  Yes Raylene Everts, MD    Allergies as of 03/11/2021 - Review Complete 03/02/2021  Allergen Reaction Noted   Amlodipine Other (See Comments) 05/06/2013   Atorvastatin Other (See Comments) 08/05/2015   Clopidogrel bisulfate Other (See Comments) 08/05/2015   Linzess [linaclotide] Rash 08/07/2018    Family History  Adopted: Yes  Problem Relation Age of Onset   Arthritis Mother    Hypertension Mother    Arthritis Father  Cancer Father        prostate   Early death Son        MVA   Heart disease Brother 72   Aneurysm Sister    Colon cancer Neg Hx     Social History   Socioeconomic History   Marital status: Single    Spouse name: Not on file   Number of children: Not on file   Years of education: Not on file   Highest education level: Not on file  Occupational History   Not on file  Tobacco Use   Smoking status: Some Days    Packs/day: 0.10    Years: 10.00    Pack years: 1.00    Types: Cigarettes   Smokeless tobacco: Never   Tobacco comments:    up to 5 cigarettes per day  Vaping Use   Vaping Use: Never used  Substance and Sexual Activity   Alcohol use: Yes     Comment: social   Drug use: Yes    Frequency: 7.0 times per week    Types: Marijuana    Comment: daily   Sexual activity: Not Currently  Other Topics Concern   Not on file  Social History Narrative   Not on file   Social Determinants of Health   Financial Resource Strain: Not on file  Food Insecurity: Not on file  Transportation Needs: Not on file  Physical Activity: Not on file  Stress: Not on file  Social Connections: Not on file  Intimate Partner Violence: Not on file    Review of Systems: See HPI, otherwise negative ROS  Physical Exam: Vital signs in last 24 hours: Temp:  [97.7 F (36.5 C)] 97.7 F (36.5 C) (07/26 0817) Resp:  [12] 12 (07/26 0817) BP: (179)/(87) 179/87 (07/26 0817) SpO2:  [100 %] 100 % (07/26 0817) Weight:  [63.5 kg] 63.5 kg (07/26 0817)   General:   Alert,  Well-developed, well-nourished, pleasant and cooperative in NAD Head:  Normocephalic and atraumatic. Eyes:  Sclera clear, no icterus.   Conjunctiva pink. Ears:  Normal auditory acuity. Nose:  No deformity, discharge,  or lesions. Mouth:  No deformity or lesions, dentition normal. Neck:  Supple; no masses or thyromegaly. Lungs:  Clear throughout to auscultation.   No wheezes, crackles, or rhonchi. No acute distress. Heart:  Regular rate and rhythm; no murmurs, clicks, rubs,  or gallops. Abdomen:  Soft, nontender and nondistended. No masses, hepatosplenomegaly or hernias noted. Normal bowel sounds, without guarding, and without rebound.   Msk:  Symmetrical without gross deformities. Normal posture. Extremities:  Without clubbing or edema. Neurologic:  Alert and  oriented x4;  grossly normal neurologically. Skin:  Intact without significant lesions or rashes. Cervical Nodes:  No significant cervical adenopathy. Psych:  Alert and cooperative. Normal mood and affect.  Impression/Plan: Sophia Briggs is here for a colonoscopy to be performed for surveillance due to personal history of  adenomatous colon polyps. Last TCS 3 years ago, 31 tubular adenomas removed.   The risks of the procedure including infection, bleed, or perforation as well as benefits, limitations, alternatives and imponderables have been reviewed with the patient. Questions have been answered. All parties agreeable.

## 2021-04-28 LAB — SURGICAL PATHOLOGY

## 2021-04-29 ENCOUNTER — Encounter (HOSPITAL_COMMUNITY): Payer: Self-pay | Admitting: Internal Medicine

## 2021-05-21 DIAGNOSIS — I10 Essential (primary) hypertension: Secondary | ICD-10-CM | POA: Diagnosis not present

## 2021-05-21 DIAGNOSIS — I2510 Atherosclerotic heart disease of native coronary artery without angina pectoris: Secondary | ICD-10-CM | POA: Diagnosis not present

## 2021-06-21 DIAGNOSIS — I10 Essential (primary) hypertension: Secondary | ICD-10-CM | POA: Diagnosis not present

## 2021-06-21 DIAGNOSIS — I2510 Atherosclerotic heart disease of native coronary artery without angina pectoris: Secondary | ICD-10-CM | POA: Diagnosis not present

## 2021-06-29 ENCOUNTER — Other Ambulatory Visit: Payer: Self-pay | Admitting: Physician Assistant

## 2021-07-21 DIAGNOSIS — I2510 Atherosclerotic heart disease of native coronary artery without angina pectoris: Secondary | ICD-10-CM | POA: Diagnosis not present

## 2021-07-21 DIAGNOSIS — I10 Essential (primary) hypertension: Secondary | ICD-10-CM | POA: Diagnosis not present

## 2021-08-11 ENCOUNTER — Ambulatory Visit: Payer: Medicare HMO | Admitting: Gastroenterology

## 2021-08-11 DIAGNOSIS — Z23 Encounter for immunization: Secondary | ICD-10-CM | POA: Diagnosis not present

## 2021-08-11 DIAGNOSIS — K219 Gastro-esophageal reflux disease without esophagitis: Secondary | ICD-10-CM | POA: Diagnosis not present

## 2021-08-11 DIAGNOSIS — I2510 Atherosclerotic heart disease of native coronary artery without angina pectoris: Secondary | ICD-10-CM | POA: Diagnosis not present

## 2021-08-11 DIAGNOSIS — I10 Essential (primary) hypertension: Secondary | ICD-10-CM | POA: Diagnosis not present

## 2021-08-11 DIAGNOSIS — M1990 Unspecified osteoarthritis, unspecified site: Secondary | ICD-10-CM | POA: Diagnosis not present

## 2021-09-10 DIAGNOSIS — I2510 Atherosclerotic heart disease of native coronary artery without angina pectoris: Secondary | ICD-10-CM | POA: Diagnosis not present

## 2021-09-10 DIAGNOSIS — I10 Essential (primary) hypertension: Secondary | ICD-10-CM | POA: Diagnosis not present

## 2021-09-16 ENCOUNTER — Encounter: Payer: Self-pay | Admitting: Cardiovascular Disease

## 2021-09-16 NOTE — Telephone Encounter (Signed)
Left voicemail message on daughters number to call back in regards to her concerns.

## 2021-09-16 NOTE — Telephone Encounter (Signed)
Left voicemail message for patient to call back in regards to her daughters concerns.

## 2021-09-16 NOTE — Telephone Encounter (Signed)
Spoke with patients daughter per release form. She states that when patient has these episodes she feels like she needs to burp. Encouraged her to try soda to see if that will help or over the counter gasX to see if that provides relief. Expressed importance that if her chest pain worsens, radiates to jaw or left arm, or she becomes diaphoretic with shortness of breath then she needs to proceed to Emergency Department for further evaluation and work up. She also wanted to schedule appointment for her mother to come in and see provider. Was able to schedule her to come in but again expressed importance if that chest pain continues or worsens she go to ED. She verbalized understanding of our conversation, agreement with plan and no further questions at this time.

## 2021-09-20 NOTE — Progress Notes (Signed)
Home        Date:  09/21/2021   ID:  [name], Sophia Briggs, MRN [id number]  Patient Location:  [place] [id number]   Provider location:   [facility], [place] office  PCP:  Rosita Fire, MD  Cardiologist:  Arvid Right [facility]  Chief Complaint  Patient presents with   Follow-up    Follow up and c/o chest pains. When she has the chest pains she has taken Nitroglycerin which does help subside the pain but it later returns. Medications verbally reviewed with patient.      History of Present Illness:    Sophia Briggs is a 75 y.o. female past medical history of coronary artery disease, previous stent x2 in 2009 to her LAD,  Hypertension, Hyperlipidemia, Continued tobacco abuse,  TIA   presented to the hospital 04/16/2013 with chest pain. Initial troponin 0.6 with MB greater than 5. Cardiac catheterization 04/2013 showed severe ostial D2 and D3 disease. The D2 ostium was at the distal end of the LAD stent. LAD stent was patent. D3 was a small vessel. Ejection fraction 55%. No intervention was performed.  She presents today for follow-up of her coronary artery disease  Last seen in clinic November 2018 Seen by one of our providers March 2021 At that time was still smoking 5 cigarettes/day and sometimes more Intolerance to statin generics, but tolerating branded Lipitor  On today's visit, reports she is still smoking down to several cigarettes per day, more with stress  Food catching in her throat Has been going on for several months On PPI  When she gets excited, Gas comes up Lots of onions, vegetables, Will get chest pain, takes nitro, with belching pain goes away  No significant pain on exertion but concerned about chest pain symptoms With walking, feels ok,  Stays active, daughter lives close by, they go shopping  Lab work 2021 Total cholesterol 150 LDL 78   EKG personally reviewed by myself on todays visit Normal sinus  rhythm rate 60 bpm left bundle branch block   Other past medical history Prior significant stress at home in the past, as her daughter is sick, has a tracheostomy tube, previous stroke.    Past Medical History:  Diagnosis Date   Acute MI [facility])    Allergy    Anxiety    Arthritis    all over   Back pain    COPD (chronic obstructive pulmonary disease) (HCC)    Coronary artery disease    CTS (carpal tunnel syndrome) 01/06/2012   GERD (gastroesophageal reflux disease)    Hyperlipidemia    Hypertension    Stroke [facility])    Syncope and collapse    TIA (transient ischemic attack)    Past Surgical History:  Procedure Laterality Date   CARDIAC CATHETERIZATION     stent placement 2006 at [facility]    COLONOSCOPY N/A 06/08/2018   Procedure: COLONOSCOPY;  Surgeon: Sophia Binder, MD;  [facility];  Service: Endoscopy;  Laterality: N/A;  12:15pm   COLONOSCOPY WITH PROPOFOL N/A 04/27/2021   Procedure: COLONOSCOPY WITH PROPOFOL;  Surgeon: Sophia Harman, DO;  [facility];  Service: Endoscopy;  Laterality: N/A;  ASA II / 12:30   PAROTIDECTOMY     POLYPECTOMY  06/08/2018   Procedure: POLYPECTOMY;  Surgeon: Sophia Binder, MD;  Location: [facility];  Service: Endoscopy;;  cecal polyps times 6 ( 4cs, 2 hs), ascending colon polyps ( 2cs,  2hs) hepatic flexure polyps (4 cs, 5 hs), transverse colon polyps  splenic flexure polyp times 2 cs, 2 cb   POLYPECTOMY  04/27/2021   Procedure: POLYPECTOMY;  Surgeon: Sophia Harman, DO;  [facility];  Service: Endoscopy;;   TUBAL LIGATION       Allergies:   Amlodipine, Atorvastatin, Clopidogrel bisulfate, and Linzess [linaclotide]   Social History   Tobacco Use   Smoking status: Some Days    Packs/day: 0.10    Years: 10.00    Pack years: 1.00    Types: Cigarettes   Smokeless tobacco: Never   Tobacco comments:    up to 5 cigarettes per day  Vaping Use   Vaping Use: Never used  Substance Use Topics    Alcohol use: Yes    Comment: social   Drug use: Yes    Frequency: 7.0 times per week    Types: Marijuana    Comment: daily    Outpatient Encounter Medications as of 09/21/2021  Medication Sig   aspirin 81 MG tablet Take 162 mg by mouth at bedtime.    cholecalciferol (VITAMIN D) 1000 UNITS tablet Take 1,000 Units by mouth daily.    hydrOXYzine (ATARAX/VISTARIL) 25 MG tablet Take 25 mg by mouth 3 (three) times daily as needed for anxiety.   atorvastatin (LIPITOR) 40 MG tablet Take 1 tablet (40 mg total) by mouth daily.   carvedilol (COREG) 25 MG tablet Take 1 tablet (25 mg total) by mouth 2 (two) times daily with a meal.   isosorbide mononitrate (IMDUR) 30 MG 24 hr tablet Take 1 tablet (30 mg total) by mouth daily.   losartan-hydrochlorothiazide (HYZAAR) 100-25 MG tablet Take 1 tablet by mouth daily.   nitroGLYCERIN (NITROSTAT) 0.4 MG SL tablet DISSOLVE ONE TABLET UNDER THE TONGUE EVERY 5 MINUTES AS NEEDED FOR CHEST PAIN.  DO NOT EXCEED A TOTAL OF 3 DOSES IN 15 MINUTES   pantoprazole (PROTONIX) 40 MG tablet Take 1 tablet (40 mg total) by mouth daily.   No facility-administered encounter medications on file as of 09/21/2021.     Family Hx: The patient's family history includes Aneurysm in her sister; Arthritis in her father and mother; Cancer in her father; Early death in her son; Heart disease (age of onset: 39) in her brother; Hypertension in her mother. There is no history of Colon cancer. She was adopted.  ROS:   Please see the history of present illness.    Review of Systems  Constitutional: Negative.   HENT: Negative.    Respiratory: Negative.    Cardiovascular:  Positive for chest pain.  Gastrointestinal:  Positive for heartburn.  Musculoskeletal:  Positive for back pain.  Neurological: Negative.   Psychiatric/Behavioral: Negative.    All other systems reviewed and are negative.   Labs/Other Tests and Data Reviewed:    Recent Labs: 04/14/2021: BUN 13; Creatinine, Ser 0.94;  Potassium 3.9; Sodium 139   Recent Lipid Panel Lab Results  Component Value Date/Time   CHOL 137 06/22/2017 10:54 AM   TRIG 85 06/22/2017 10:54 AM   HDL 45 (L) 06/22/2017 10:54 AM   CHOLHDL 3.0 06/22/2017 10:54 AM   LDLCALC 75 06/22/2017 10:54 AM    Wt Readings from Last 3 Encounters:  09/21/21 143 lb (64.9 kg)  04/27/21 140 lb (63.5 kg)  03/02/21 139 lb 12.8 oz (63.4 kg)     Exam:    Vital Signs: Vital signs may also be detailed in the HPI BP 112/68 (BP Location: Left Arm,  Patient Position: Sitting, Cuff Size: Normal)    Pulse 60    Ht 5\' 7"  (1.702 m)    Wt 143 lb (64.9 kg)    LMP 11/14/1997    SpO2 98%    BMI 22.40 kg/m   Constitutional:  oriented to person, place, and time. No distress.  HENT:  Head: Grossly normal Eyes:  no discharge. No scleral icterus.  Neck: No JVD, no carotid bruits  Cardiovascular: Regular rate and rhythm, no murmurs appreciated Pulmonary/Chest: Clear to auscultation bilaterally, no wheezes or rails Abdominal: Soft.  no distension.  no tenderness.  Musculoskeletal: Normal range of motion Neurological:  normal muscle tone. Coordination normal. No atrophy Skin: Skin warm and dry Psychiatric: normal affect, pleasant   ASSESSMENT & PLAN:    Coronary artery disease of native artery of native heart with stable angina pectoris (HCC) -   some typical and atypical features of her chest discomfort and shortness of breath No recent ischemic work-up Unable to treadmill Not a good candidate for cardiac CTA given prior stenting We have recommended pharmacologic Myoview for further evaluation  Smoker We have encouraged her to continue to work on weaning her cigarettes and smoking cessation. She will continue to work on this and does not want any assistance with chantix.    Mixed hyperlipidemia -  Cholesterol is at goal on the current lipid regimen. No changes to the medications were made.  HTN Blood pressure is well controlled on today's visit. No  changes made to the medications.  Dysphagia Reports food sticking, lots of gas, chest pain relieved with belching Recommend she avoid onions, consider Gas-X/simethicone If symptoms persist could try extra PPI twice daily She does have GI in [place], suggested she follow-up with them, may need EGD for  "food sticking"   Total encounter time more than 25 minutes  Greater than 50% was spent in counseling and coordination of care with the patient    Signed, Ida Rogue, MD  09/21/2021 1:06 PM    [place] [facility] [address], [place], [place] [address]

## 2021-09-21 ENCOUNTER — Ambulatory Visit: Payer: Medicare HMO | Admitting: Cardiovascular Disease

## 2021-09-21 ENCOUNTER — Other Ambulatory Visit: Payer: Self-pay

## 2021-09-21 ENCOUNTER — Encounter: Payer: Self-pay | Admitting: Cardiovascular Disease

## 2021-09-21 VITALS — BP 112/68 | HR 60 | Ht 67.0 in | Wt 143.0 lb

## 2021-09-21 DIAGNOSIS — I2510 Atherosclerotic heart disease of native coronary artery without angina pectoris: Secondary | ICD-10-CM | POA: Diagnosis not present

## 2021-09-21 DIAGNOSIS — E785 Hyperlipidemia, unspecified: Secondary | ICD-10-CM | POA: Diagnosis not present

## 2021-09-21 DIAGNOSIS — I10 Essential (primary) hypertension: Secondary | ICD-10-CM | POA: Diagnosis not present

## 2021-09-21 DIAGNOSIS — Z720 Tobacco use: Secondary | ICD-10-CM

## 2021-09-21 MED ORDER — NITROGLYCERIN 0.4 MG SL SUBL: SUBLINGUAL_TABLET | SUBLINGUAL | 0 refills | Status: DC

## 2021-09-21 MED ORDER — PANTOPRAZOLE SODIUM 40 MG PO TBEC: 40.0000 mg | DELAYED_RELEASE_TABLET | Freq: Every day | ORAL | 3 refills | Status: AC

## 2021-09-21 MED ORDER — PANTOPRAZOLE SODIUM 40 MG PO TBEC: 40.0000 mg | DELAYED_RELEASE_TABLET | Freq: Every day | ORAL | 3 refills | Status: DC

## 2021-09-21 MED ORDER — CARVEDILOL 25 MG PO TABS: 25.0000 mg | ORAL_TABLET | Freq: Two times a day (BID) | ORAL | 3 refills | Status: DC

## 2021-09-21 MED ORDER — ISOSORBIDE MONONITRATE ER 30 MG PO TB24: 30.0000 mg | ORAL_TABLET | Freq: Every day | ORAL | 3 refills | Status: DC

## 2021-09-21 MED ORDER — LOSARTAN POTASSIUM-HCTZ 100-25 MG PO TABS: 1.0000 | ORAL_TABLET | Freq: Every day | ORAL | 3 refills | Status: DC

## 2021-09-21 MED ORDER — ATORVASTATIN CALCIUM 40 MG PO TABS: 40.0000 mg | ORAL_TABLET | Freq: Every day | ORAL | 3 refills | Status: DC

## 2021-09-21 NOTE — Patient Instructions (Addendum)
Medication Instructions:  Your physician recommends that you continue on your current medications as directed. Please refer to the Current Medication list given to you today.   If you need a refill on your cardiac medications before your next appointment, please call your pharmacy.    Lab work: No new labs needed   If you have labs (blood work) drawn today and your tests are completely normal, you will receive your results only by: [facility] (if you have MyChart) OR A paper copy in the mail If you have any lab test that is abnormal or we need to change your treatment, we will call you to review the results.   Testing/Procedures:  [place]  Your caregiver has ordered a Stress Test with nuclear imaging. The purpose of this test is to evaluate the blood supply to your heart muscle. This procedure is referred to as a "Non-Invasive Stress Test." This is because other than having an IV started in your vein, nothing is inserted or "invades" your body. Cardiac stress tests are done to find areas of poor blood flow to the heart by determining the extent of coronary artery disease (CAD). Some patients exercise on a treadmill, which naturally increases the blood flow to your heart, while others who are  unable to walk on a treadmill due to physical limitations have a pharmacologic/chemical stress agent called Lexiscan . This medicine will mimic walking on a treadmill by temporarily increasing your coronary blood flow.   Please note: these test may take anywhere between 2-4 hours to complete  PLEASE REPORT TO [facility] AT THE FIRST DESK WILL DIRECT YOU WHERE TO GO  Date of Procedure:_____________________________________  Arrival Time for Procedure:______________________________  Instructions regarding medication:    PLEASE NOTIFY THE OFFICE AT LEAST 24 HOURS IN ADVANCE IF YOU ARE UNABLE TO KEEP YOUR APPOINTMENT.  [phone] AND  PLEASE NOTIFY  NU[facility]ICINE AT [facility] AT LEAST 24 HOURS IN ADVANCE IF YOU ARE UNABLE TO KEEP YOUR APPOINTMENT. [phone]  How to prepare for your Myoview test:  Do not eat or drink after midnight No caffeine for 24 hours prior to test No smoking 24 hours prior to test. Your medication may be taken with water.  If your doctor stopped a medication because of this test, do not take that medication. Ladies, please do not wear dresses.  Skirts or pants are appropriate. Please wear a short sleeve shirt. No perfume, cologne or lotion. Wear comfortable walking shoes. No heels!    Follow-Up: At [facility], you and your health needs are our priority.  As part of our continuing mission to provide you with exceptional heart care, we have created designated Provider Care Teams.  These Care Teams include your primary Cardiologist (physician) and Advanced Practice Providers (APPs -  Physician Assistants and Nurse Practitioners) who all work together to provide you with the care you need, when you need it.  Your physician wants you to follow-up in: 1 year You will receive a reminder letter in the mail two months in advance. If you don't receive a letter, please call our office to schedule the follow-up appointment.   Providers on your designated Care Team:   Murray Hodgkins, NP Christell Faith, PA-C Marrianne Mood, PA-C Cadence Kathlen Mody, [place]  Any Other Special Instructions Will Be Listed Below (If Applicable).   Hold the onions Soda/gas-x/simethicone for gas pressure Ok to try extra protonix am/pm for 1-2 weeks to see if this helps  COVID-19 Vaccine  Information can be found at: ShippingScam.co.uk For questions related to vaccine distribution or appointments, please email vaccine@[email or web address] [email or web address] or call [phone].

## 2021-09-28 ENCOUNTER — Encounter: Payer: Self-pay | Admitting: *Deleted

## 2021-10-11 ENCOUNTER — Encounter
Admission: RE | Admit: 2021-10-11 | Discharge: 2021-10-11 | Disposition: A | Discharge status: Home / Self Care | Payer: Medicare HMO | Source: Ambulatory Visit | Attending: Cardiovascular Disease | Admitting: Cardiovascular Disease

## 2021-10-11 ENCOUNTER — Other Ambulatory Visit: Payer: Self-pay

## 2021-10-11 DIAGNOSIS — I10 Essential (primary) hypertension: Secondary | ICD-10-CM | POA: Diagnosis not present

## 2021-10-11 DIAGNOSIS — I2510 Atherosclerotic heart disease of native coronary artery without angina pectoris: Secondary | ICD-10-CM

## 2021-10-11 MED ORDER — TECHNETIUM TC 99M TETROFOSMIN IV KIT
10.0000 | PACK | Freq: Once | INTRAVENOUS | Status: AC | PRN
  Administered 2021-10-11: 10.42 via INTRAVENOUS

## 2021-10-11 MED ORDER — TECHNETIUM TC 99M TETROFOSMIN IV KIT
30.0000 | PACK | Freq: Once | INTRAVENOUS | Status: AC
  Administered 2021-10-11: 31.11 via INTRAVENOUS

## 2021-10-11 MED ORDER — REGADENOSON 0.4 MG/5ML IV SOLN
0.4000 mg | Freq: Once | INTRAVENOUS | Status: AC
  Administered 2021-10-11: 0.4 mg via INTRAVENOUS

## 2021-10-13 LAB — NM MYOCAR MULTI W/SPECT W/WALL MOTION / EF
LV dias vol: 72 mL (ref 46–106)
LV sys vol: 25 mL
Nuc Stress EF: 65 %
Peak HR: 90 {beats}/min
Percent HR: 61 %
Rest HR: 53 {beats}/min
Rest Nuclear Isotope Dose: 10.4 mCi
SDS: 0
SRS: 3
SSS: 1
ST Depression (mm): 0 mm
Stress Nuclear Isotope Dose: 31.1 mCi
TID: 1

## 2021-10-18 ENCOUNTER — Telehealth: Payer: Self-pay

## 2021-10-18 NOTE — Telephone Encounter (Signed)
Left detail message on VM of pt's recent results okay by DPR, Dr. Rockey Situ advised   "Stress test  No significant ischemia  Normal ejection fraction "  At this time, no further recommendations or medications changes, advised to call office for any concerns or questions, otherwise will see at next visit.   Results released to MyChart by Dr. Rockey Situ Seen by patient Sophia Briggs on 10/17/2021  7:12 PM

## 2021-11-11 DIAGNOSIS — I10 Essential (primary) hypertension: Secondary | ICD-10-CM | POA: Diagnosis not present

## 2021-11-11 DIAGNOSIS — I2510 Atherosclerotic heart disease of native coronary artery without angina pectoris: Secondary | ICD-10-CM | POA: Diagnosis not present

## 2021-11-16 ENCOUNTER — Other Ambulatory Visit: Payer: Self-pay | Admitting: Cardiovascular Disease

## 2021-12-22 ENCOUNTER — Ambulatory Visit: Payer: Medicare HMO | Admitting: Gastroenterology

## 2021-12-22 ENCOUNTER — Other Ambulatory Visit: Payer: Self-pay

## 2021-12-22 ENCOUNTER — Encounter: Payer: Self-pay | Admitting: *Deleted

## 2021-12-22 ENCOUNTER — Telehealth: Payer: Self-pay | Admitting: *Deleted

## 2021-12-22 ENCOUNTER — Encounter: Payer: Self-pay | Admitting: Gastroenterology

## 2021-12-22 VITALS — BP 142/90 | HR 56 | Temp 96.9°F | Ht 67.0 in | Wt 144.0 lb

## 2021-12-22 DIAGNOSIS — K5900 Constipation, unspecified: Secondary | ICD-10-CM | POA: Diagnosis not present

## 2021-12-22 DIAGNOSIS — Z86010 Personal history of colonic polyps: Secondary | ICD-10-CM

## 2021-12-22 MED ORDER — NA SULFATE-K SULFATE-MG SULF 17.5-3.13-1.6 GM/177ML PO SOLN: 1.0000 | Freq: Once | ORAL | 0 refills | Status: AC

## 2021-12-22 NOTE — Progress Notes (Signed)
? ? ? ?GI Office Note   ? ?Referring Provider: Carrolyn Meiers* ?Primary Care Physician:  Carrolyn Meiers, MD  ?Primary Gastroenterologist: Elon Alas. Abbey Chatters, DO ? ? ?Chief Complaint  ? ?Chief Complaint  ?Patient presents with  ? Colonoscopy  ? ? ? ?History of Present Illness  ? ?Sophia Briggs is a 76 y.o. female presenting today to discuss colonoscopy, bowel prep.  She has a history of multiple tubular adenomas, 31 simple adenomas removed at time of colonoscopy in 2019.  Advised for 3-year follow-up. ? ?Attempted colonoscopy in July 2022, bowel prep was inadequate.  Recommended to have short interval follow-up colonoscopy.  ? ?Patient used split dose Clenpiq all the day before procedure last time.  Patient states that historically she has difficulty moving her bowels.  She followed all of her prep instructions but it just did not work well.  Typically has only 1 or 2 bowel movements per week.  Uses bisacodyl 3 tablets/week.  Denies any melena or rectal bleeding.  Heartburn has been well controlled on pantoprazole.  Denies abdominal pain.  She states she does not tolerate TriLyte prep due to nausea and vomiting. ? ? ?Colonoscopy July 2022: ?- Preparation of the colon was inadequate. ?- Non-bleeding internal hemorrhoids. ?- Diverticulosis in the sigmoid colon and in the descending colon. ?- Four 3 to 8 mm polyps in the ascending colon and in the cecum, removed with a cold ?snare. Resected and retrieved. ?- Four 4 to 7 mm polyps in the transverse colon, removed with a cold snare. Resected and ?retrieved. ?- Stool in the transverse colon, in the ascending colon and in the cecum. ? ?Medications  ? ?Current Outpatient Medications  ?Medication Sig Dispense Refill  ? aspirin 81 MG tablet Take 162 mg by mouth at bedtime.     ? atorvastatin (LIPITOR) 40 MG tablet Take 1 tablet (40 mg total) by mouth daily. 90 tablet 3  ? carvedilol (COREG) 25 MG tablet Take 1 tablet (25 mg total) by mouth 2 (two) times  daily with a meal. 180 tablet 3  ? cholecalciferol (VITAMIN D) 1000 UNITS tablet Take 2,000 Units by mouth daily.    ? hydrOXYzine (ATARAX/VISTARIL) 25 MG tablet Take 25 mg by mouth 3 (three) times daily as needed for anxiety.    ? isosorbide mononitrate (IMDUR) 30 MG 24 hr tablet Take 1 tablet (30 mg total) by mouth daily. 90 tablet 3  ? losartan-hydrochlorothiazide (HYZAAR) 100-25 MG tablet Take 1 tablet by mouth daily. 90 tablet 3  ? nitroGLYCERIN (NITROSTAT) 0.4 MG SL tablet DISSOLVE ONE TABLET UNDER THE TONGUE EVERY 5 MINUTES AS NEEDED FOR CHEST PAIN.  DO NOT EXCEED A TOTAL OF 3 DOSES IN 15 MINUTES 25 tablet 0  ? pantoprazole (PROTONIX) 40 MG tablet Take 1 tablet (40 mg total) by mouth daily. 90 tablet 3  ? ?No current facility-administered medications for this visit.  ? ? ?Allergies  ? ?Allergies as of 12/22/2021 - Review Complete 12/22/2021  ?Allergen Reaction Noted  ? Amlodipine Other (See Comments) 05/06/2013  ? Atorvastatin Other (See Comments) 08/05/2015  ? Clopidogrel bisulfate Other (See Comments) 08/05/2015  ? Linzess [linaclotide] Rash 08/07/2018  ? ? ?Past Medical History  ? ?Past Medical History:  ?Diagnosis Date  ? Acute MI ([place])   ? Allergy   ? Anxiety   ? Arthritis   ? all over  ? Back pain   ? COPD (chronic obstructive pulmonary disease) ([place])   ? Coronary artery disease   ?  CTS (carpal tunnel syndrome) 01/06/2012  ? GERD (gastroesophageal reflux disease)   ? Hyperlipidemia   ? Hypertension   ? Stroke [facility])   ? Syncope and collapse   ? TIA (transient ischemic attack)   ? ? ?Past Surgical History  ? ?Past Surgical History:  ?Procedure Laterality Date  ? CARDIAC CATHETERIZATION    ? stent placement 2006 at [facility]   ? COLONOSCOPY N/A 06/08/2018  ? Procedure: COLONOSCOPY;  Surgeon: Danie Binder, MD;  [facility];  Service: Endoscopy;  Laterality: N/A;  12:15pm  ? COLONOSCOPY WITH PROPOFOL N/A 04/27/2021  ? Procedure: COLONOSCOPY WITH PROPOFOL;  Surgeon: Eloise Harman, DO;   [facility];  Service: Endoscopy;  Laterality: N/A;  ASA II / 12:30  ? PAROTIDECTOMY    ? POLYPECTOMY  06/08/2018  ? Procedure: POLYPECTOMY;  Surgeon: Danie Binder, MD;  [facility];  Service: Endoscopy;;  cecal polyps times 6 ( 4cs, 2 hs), ascending colon polyps ( 2cs, 2hs) hepatic flexure polyps (4 cs, 5 hs), transverse colon polyps  splenic flexure polyp times 2 cs, 2 cb  ? POLYPECTOMY  04/27/2021  ? Procedure: POLYPECTOMY;  Surgeon: Eloise Harman, DO;  [facility];  Service: Endoscopy;;  ? TUBAL LIGATION    ? ? ?Past Family History  ? ?Family History  ?Adopted: Yes  ?Problem Relation Age of Onset  ? Arthritis Mother   ? Hypertension Mother   ? Arthritis Father   ? Cancer Father   ?     prostate  ? Early death Son   ?     MVA  ? Heart disease Brother 61  ? Aneurysm Sister   ? Colon cancer Neg Hx   ? ? ?Past Social History  ? ?Social History  ? ?Socioeconomic History  ? Marital status: Single  ?  Spouse name: Not on file  ? Number of children: Not on file  ? Years of education: Not on file  ? Highest education level: Not on file  ?Occupational History  ? Not on file  ?Tobacco Use  ? Smoking status: Some Days  ?  Packs/day: 0.10  ?  Years: 10.00  ?  Pack years: 1.00  ?  Types: Cigarettes  ? Smokeless tobacco: Never  ? Tobacco comments:  ?  up to 5 cigarettes per day  ?Vaping Use  ? Vaping Use: Never used  ?Substance and Sexual Activity  ? Alcohol use: Yes  ?  Comment: social  ? Drug use: Yes  ?  Frequency: 7.0 times per week  ?  Types: Marijuana  ?  Comment: daily  ? Sexual activity: Yes  ?Other Topics Concern  ? Not on file  ?Social History Narrative  ? Not on file  ? ?Social Determinants of Health  ? ?Financial Resource Strain: Not on file  ?Food Insecurity: Not on file  ?Transportation Needs: Not on file  ?Physical Activity: Not on file  ?Stress: Not on file  ?Social Connections: Not on file  ?Intimate Partner Violence: Not on file  ? ? ?Review of Systems  ? ?General:  Negative for anorexia, weight loss, fever, chills, fatigue, weakness. ?Eyes: Negative for vision changes.  ?ENT: Negative for hoarseness, difficulty swallowing , nasal congestion. ?CV: Negative for chest pain, angina, palpitations, dyspnea on exertion, peripheral edema.  ?Respiratory: Negative for dyspnea at rest, dyspnea on exertion, cough, sputum, wheezing.  ?GI: See history of present illness. ?GU:  Negative for dysuria, hematuria,  urinary incontinence, urinary frequency, nocturnal urination.  ?MS: Negative for joint pain, low back pain.  ?Derm: Negative for rash or itching.  ?Neuro: Negative for weakness, abnormal sensation, seizure, frequent headaches, memory loss,  ?confusion.  ?Psych: Negative for anxiety, depression, suicidal ideation, hallucinations.  ?Endo: Negative for unusual weight change.  ?Heme: Negative for bruising or bleeding. ?Allergy: Negative for rash or hives. ? ?Physical Exam  ? ?BP (!) 142/90 (BP Location: Left Arm, Patient Position: Sitting, Cuff Size: Normal)   Pulse (!) 56   Temp (!) 96.9 ?F (36.1 ?C) (Temporal)   Ht '5\' 7"'$  (1.702 m)   Wt 144 lb (65.3 kg)   LMP 11/14/1997   SpO2 99%   BMI 22.55 kg/m?  ?  ?General: Well-nourished, well-developed in no acute distress.  ?Head: Normocephalic, atraumatic.   ?Eyes: Conjunctiva pink, no icterus. ?Mouth: masked ?Neck: Supple without thyromegaly, masses, or lymphadenopathy.  ?Lungs: Clear to auscultation bilaterally.  ?Heart: Regular rate and rhythm, no murmurs rubs or gallops.  ?Abdomen: Bowel sounds are normal, nontender, nondistended, no hepatosplenomegaly or masses,  ?no abdominal bruits or hernia, no rebound or guarding.   ?Rectal: not performed ?Extremities: No lower extremity edema. No clubbing or deformities.  ?Neuro: Alert and oriented x 4 , grossly normal neurologically.  ?Skin: Warm and dry, no rash or jaundice.   ?Psych: Alert and cooperative, normal mood and affect. ? ?Labs  ? ?None available ? ?Imaging Studies  ? ?No results  found. ? ?Assessment  ? ?History of adenomatous colon polyps: Incomplete colonoscopy last year.  Patient states she took her prep appropriately but historically is chronically constipated and this likely contribut

## 2021-12-22 NOTE — Telephone Encounter (Signed)
Daughter called back. Pt scheduled for 4/24 at 9:15am. Aware will send prep rx to pharmacy. Will mail prep instructions. Confirmed address. Will inform with pre-op appt details. ?Suprep sent in. Per encounter form no clenpiq/trilyte (vomits). If no affordable options then can give sample of clenpiq (although we currently don't have any samples).  ? ?Called pt, no answer and VM full to give pre-op appt details. Called daughter received VM ?

## 2021-12-22 NOTE — Patient Instructions (Addendum)
For chronic constipation, start MiraLAX (or store brand) 17 g mixed in 4 to 6 ounces of liquid, take twice daily until soft stool, then decrease to once daily.  Take every day unless you develop to frequent stools at which time you can take other day.  We have given you samples to get started.  Each sample is 1 dose. ?If your bowel movements do not become regular, please let us know so we can make adjustments prior to your colonoscopy. ?We will call and get you scheduled for a colonoscopy once we determine which bowel prep you will be receiving.  Per your request, we will contact your daughter, [name], to make arrangements. ?

## 2021-12-22 NOTE — Telephone Encounter (Signed)
Per pt she wanted me to call daughter [name] to schedule procedure ? ?Called pt daughter and LMOVM to call back to schedule TCS with Dr. Abbey Chatters, ASA 3. See encounter form regarding instructions. ?

## 2021-12-23 ENCOUNTER — Encounter: Payer: Self-pay | Admitting: Gastroenterology

## 2021-12-23 ENCOUNTER — Telehealth: Payer: Self-pay | Admitting: Gastroenterology

## 2021-12-23 NOTE — Telephone Encounter (Signed)
Choices: call rep and ask for clenpiq, if not available, patient may have to try the trilyte since she can't afford the suprep. She is not a candidate for Miralax given failed prep. ? ?Anything else on her formulary? ?

## 2021-12-23 NOTE — Telephone Encounter (Signed)
Please call patient/daughter and left any know I inadvertently gave her Linzess samples yesterday as part of her bowel prep but noticed that she has listed as having a rash to Linzess before. ? ?Please ask her to not take the [place]. ? ?For her bowel prep instructions, instead of Linzess, please ask her to take bisacodyl 10 mg daily on April 19, 20, 21, 22. ? ?Please send new instructions. I hope that she can afford the suprep, last time they said she could not. Would encourage them to go to pharmacy and get prep as soon as they can to make sure no issues. ? ?

## 2021-12-23 NOTE — Telephone Encounter (Signed)
Angie has already [employer] rep and is awaiting response. ? ?Tried to call daughter, LMOVM for return call. ?

## 2021-12-23 NOTE — Telephone Encounter (Signed)
Spoke to pt's daughter, informed her pt should not take Linzess samples and take Bisacodyl instead prior to TCS. Pt is unable to afford Suprep, it was over $100. We don't have any samples. Magda Paganini, please advise about prep. ?

## 2021-12-23 NOTE — Telephone Encounter (Signed)
Tried to call daughter [name]), LMOVM for return call. ? ?New instructions mailed with pre-op appt letter. ?

## 2021-12-27 ENCOUNTER — Telehealth: Payer: Self-pay

## 2021-12-27 NOTE — Telephone Encounter (Signed)
Pt called a left a message stating that the miralax is working. Pt also was requesting an rx to be called in to her pharmacy. Tried to reach patient to let her know that she can purchase it over the counter. Left vm for pt to return my call.  ?

## 2021-12-29 NOTE — Telephone Encounter (Signed)
Clenpiq rep isn't able to get samples at this time. Please advise re: prep. ?

## 2021-12-30 DIAGNOSIS — I10 Essential (primary) hypertension: Secondary | ICD-10-CM | POA: Diagnosis not present

## 2021-12-30 DIAGNOSIS — I2510 Atherosclerotic heart disease of native coronary artery without angina pectoris: Secondary | ICD-10-CM | POA: Diagnosis not present

## 2021-12-30 DIAGNOSIS — K219 Gastro-esophageal reflux disease without esophagitis: Secondary | ICD-10-CM | POA: Diagnosis not present

## 2021-12-30 DIAGNOSIS — Z1389 Encounter for screening for other disorder: Secondary | ICD-10-CM | POA: Diagnosis not present

## 2021-12-30 DIAGNOSIS — Z23 Encounter for immunization: Secondary | ICD-10-CM | POA: Diagnosis not present

## 2021-12-30 DIAGNOSIS — F17210 Nicotine dependence, cigarettes, uncomplicated: Secondary | ICD-10-CM | POA: Diagnosis not present

## 2021-12-30 DIAGNOSIS — Z0001 Encounter for general adult medical examination with abnormal findings: Secondary | ICD-10-CM | POA: Diagnosis not present

## 2021-12-30 DIAGNOSIS — R69 Illness, unspecified: Secondary | ICD-10-CM | POA: Diagnosis not present

## 2021-12-30 DIAGNOSIS — Z1331 Encounter for screening for depression: Secondary | ICD-10-CM | POA: Diagnosis not present

## 2021-12-31 DIAGNOSIS — I10 Essential (primary) hypertension: Secondary | ICD-10-CM | POA: Diagnosis not present

## 2021-12-31 DIAGNOSIS — Z0001 Encounter for general adult medical examination with abnormal findings: Secondary | ICD-10-CM | POA: Diagnosis not present

## 2021-12-31 DIAGNOSIS — I2510 Atherosclerotic heart disease of native coronary artery without angina pectoris: Secondary | ICD-10-CM | POA: Diagnosis not present

## 2021-12-31 DIAGNOSIS — G8929 Other chronic pain: Secondary | ICD-10-CM | POA: Diagnosis not present

## 2021-12-31 DIAGNOSIS — Z79899 Other long term (current) drug therapy: Secondary | ICD-10-CM | POA: Diagnosis not present

## 2021-12-31 DIAGNOSIS — K219 Gastro-esophageal reflux disease without esophagitis: Secondary | ICD-10-CM | POA: Diagnosis not present

## 2022-01-02 NOTE — Telephone Encounter (Signed)
I am out of options. Would not advise miralax prep due to poor prep in past.  ? ?She will need trilyte, split dose but all the day before.  ?Given bisacodyl '10mg'$  daily four days before prep. ?Two full days of clear liquids. ? ?

## 2022-01-03 MED ORDER — PEG 3350-KCL-NA BICARB-NACL 420 G PO SOLR: 4000.0000 mL | ORAL | 0 refills | Status: DC

## 2022-01-03 NOTE — Addendum Note (Signed)
Addended by: Hassan Rowan on: 01/03/2022 08:32 AM ? ? Modules accepted: Orders ? ?

## 2022-01-03 NOTE — Telephone Encounter (Signed)
Called daughter (Ms. Toler), informed her of Leslie's prep instructions and pre-op appt. Rx for prep sent to pharmacy. Instructions sent to MyChart and mailed with pre-op appt letter.  ?

## 2022-01-20 NOTE — Patient Instructions (Signed)
? ? ? ? ? ? Sophia Briggs ? 01/20/2022  ?  ? '@PREFPERIOPPHARMACY'$ @ ? ? Your procedure is scheduled on  01/24/2022. ? ? Report to [facility] at  0730 A.M. ? ? Call this number if you have problems the morning of surgery: ? [phone] ? ? Remember: ? Follow the diet and prep instructions given to you by the office. ?  ? Take these medicines the morning of surgery with A SIP OF WATER  ? ?                  Carvedilol, hydroxyzine, imdur, protonix. ?  ? ? Do not wear jewelry, make-up or nail polish. ? Do not wear lotions, powders, or perfumes, or deodorant. ? Do not shave 48 hours prior to surgery.  Men may shave face and neck. ? Do not bring valuables to the hospital. ?  is not responsible for any belongings or valuables. ? ?Contacts, dentures or bridgework may not be worn into surgery.  Leave your suitcase in the car.  After surgery it may be brought to your room. ? ?For patients admitted to the hospital, discharge time will be determined by your treatment team. ? ?Patients discharged the day of surgery will not be allowed to drive home and must have someone with them for 24 hours.  ? ? ?Special instructions:   DO NOT smoke tobacco or vape for 24 hours before your procedure. ? ?Please read over the following fact sheets that you were given. ?Anesthesia Post-op Instructions and Care and Recovery After Surgery ?  ? ? ? Colonoscopy, Adult, Care After ?The following information offers guidance on how to care for yourself after your procedure. Your health care provider may also give you more specific instructions. If you have problems or questions, contact your health care provider. ?What can I expect after the procedure? ?After the procedure, it is common to have: ?A small amount of blood in your stool for 24 hours after the procedure. ?Some gas. ?Mild cramping or bloating of your abdomen. ?Follow these instructions at home: ?Eating and drinking ? ?Drink enough fluid to keep your urine pale yellow. ?Follow  instructions from your health care provider about eating or drinking restrictions. ?Resume your normal diet as told by your health care provider. Avoid heavy or fried foods that are hard to digest. ?Activity ?Rest as told by your health care provider. ?Avoid sitting for a long time without moving. Get up to take short walks every 1-2 hours. This is important to improve blood flow and breathing. Ask for help if you feel weak or unsteady. ?Return to your normal activities as told by your health care provider. Ask your health care provider what activities are safe for you. ?Managing cramping and bloating ? ?Try walking around when you have cramps or feel bloated. ?If directed, apply heat to your abdomen as told by your health care provider. Use the heat source that your health care provider recommends, such as a moist heat pack or a heating pad. ?Place a towel between your skin and the heat source. ?Leave the heat on for 20-30 minutes. ?Remove the heat if your skin turns bright red. This is especially important if you are unable to feel pain, heat, or cold. You have a greater risk of getting burned. ?General instructions ?If you were given a sedative during the procedure, it can affect you for several hours. Do not drive or operate machinery until your health care provider says that it  is safe. ?For the first 24 hours after the procedure: ?Do not sign important documents. ?Do not drink alcohol. ?Do your regular daily activities at a slower pace than normal. ?Eat soft foods that are easy to digest. ?Take over-the-counter and prescription medicines only as told by your health care provider. ?Keep all follow-up visits. This is important. ?Contact a health care provider if: ?You have blood in your stool 2-3 days after the procedure. ?Get help right away if: ?You have more than a small spotting of blood in your stool. ?You have large blood clots in your stool. ?You have swelling of your abdomen. ?You have nausea or  vomiting. ?You have a fever. ?You have increasing pain in your abdomen that is not relieved with medicine. ?These symptoms may be an emergency. Get help right away. Call 911. ?Do not wait to see if the symptoms will go away. ?Do not drive yourself to the hospital. ?Summary ?After the procedure, it is common to have a small amount of blood in your stool. You may also have mild cramping and bloating of your abdomen. ?If you were given a sedative during the procedure, it can affect you for several hours. Do not drive or operate machinery until your health care provider says that it is safe. ?Get help right away if you have a lot of blood in your stool, nausea or vomiting, a fever, or increased pain in your abdomen. ?This information is not intended to replace advice given to you by your health care provider. Make sure you discuss any questions you have with your health care provider. ?Document Revised: 05/12/2021 Document Reviewed: 05/12/2021 ?Elsevier Patient Education ? [place]. ?Monitored Anesthesia Care, Care After ?This sheet gives you information about how to care for yourself after your procedure. Your health care provider may also give you more specific instructions. If you have problems or questions, contact your health care provider. ?What can I expect after the procedure? ?After the procedure, it is common to have: ?Tiredness. ?Forgetfulness about what happened after the procedure. ?Impaired judgment for important decisions. ?Nausea or vomiting. ?Some difficulty with balance. ?Follow these instructions at home: ?For the time period you were told by your health care provider: ? ?  ? ?Rest as needed. ?Do not participate in activities where you could fall or become injured. ?Do not drive or use machinery. ?Do not drink alcohol. ?Do not take sleeping pills or medicines that cause drowsiness. ?Do not make important decisions or sign legal documents. ?Do not take care of children on your own. ?Eating and  drinking ?Follow the diet that is recommended by your health care provider. ?Drink enough fluid to keep your urine pale yellow. ?If you vomit: ?Drink water, juice, or soup when you can drink without vomiting. ?Make sure you have little or no nausea before eating solid foods. ?General instructions ?Have a responsible adult stay with you for the time you are told. It is important to have someone help care for you until you are awake and alert. ?Take over-the-counter and prescription medicines only as told by your health care provider. ?If you have sleep apnea, surgery and certain medicines can increase your risk for breathing problems. Follow instructions from your health care provider about wearing your sleep device: ?Anytime you are sleeping, including during daytime naps. ?While taking prescription pain medicines, sleeping medicines, or medicines that make you drowsy. ?Avoid smoking. ?Keep all follow-up visits as told by your health care provider. This is important. ?Contact a health care provider  if: ?You keep feeling nauseous or you keep vomiting. ?You feel light-headed. ?You are still sleepy or having trouble with balance after 24 hours. ?You develop a rash. ?You have a fever. ?You have redness or swelling around the IV site. ?Get help right away if: ?You have trouble breathing. ?You have new-onset confusion at home. ?Summary ?For several hours after your procedure, you may feel tired. You may also be forgetful and have poor judgment. ?Have a responsible adult stay with you for the time you are told. It is important to have someone help care for you until you are awake and alert. ?Rest as told. Do not drive or operate machinery. Do not drink alcohol or take sleeping pills. ?Get help right away if you have trouble breathing, or if you suddenly become confused. ?This information is not intended to replace advice given to you by your health care provider. Make sure you discuss any questions you have with your  health care provider. ?Document Revised: 08/24/2021 Document Reviewed: 08/22/2019 ?Elsevier Patient Education ? [place]. ? ?

## 2022-01-21 ENCOUNTER — Encounter (HOSPITAL_COMMUNITY)
Admission: RE | Admit: 2022-01-21 | Discharge: 2022-01-21 | Disposition: A | Discharge status: Home / Self Care | Payer: Medicare HMO | Source: Ambulatory Visit | Attending: Internal Medicine | Admitting: Internal Medicine

## 2022-01-21 VITALS — BP 157/92 | HR 52 | Temp 97.5°F | Resp 18 | Ht 67.0 in | Wt 140.0 lb

## 2022-01-21 DIAGNOSIS — Z01812 Encounter for preprocedural laboratory examination: Secondary | ICD-10-CM | POA: Diagnosis not present

## 2022-01-21 DIAGNOSIS — Z79899 Other long term (current) drug therapy: Secondary | ICD-10-CM

## 2022-01-21 LAB — BASIC METABOLIC PANEL
Anion gap: 6 (ref 5–15)
BUN: 22 mg/dL (ref 8–23)
CO2: 30 mmol/L (ref 22–32)
Calcium: 9.7 mg/dL (ref 8.9–10.3)
Chloride: 105 mmol/L (ref 98–111)
Creatinine, Ser: 1.02 mg/dL — ABNORMAL HIGH (ref 0.44–1.00)
GFR, Estimated: 58 mL/min — ABNORMAL LOW (ref >60)
Glucose, Bld: 107 mg/dL — ABNORMAL HIGH (ref 70–99)
Potassium: 4.2 mmol/L (ref 3.5–5.1)
Sodium: 141 mmol/L (ref 135–145)

## 2022-01-24 ENCOUNTER — Other Ambulatory Visit: Payer: Self-pay

## 2022-01-24 ENCOUNTER — Ambulatory Visit (HOSPITAL_COMMUNITY)
Admission: RE | Admit: 2022-01-24 | Discharge: 2022-01-24 | Disposition: A | Discharge status: Home / Self Care | Payer: Medicare HMO | Attending: Internal Medicine | Admitting: Internal Medicine

## 2022-01-24 ENCOUNTER — Ambulatory Visit (HOSPITAL_COMMUNITY): Payer: Medicare HMO | Admitting: Anesthesiology

## 2022-01-24 ENCOUNTER — Encounter (HOSPITAL_COMMUNITY): Payer: Self-pay

## 2022-01-24 ENCOUNTER — Encounter (HOSPITAL_COMMUNITY)
Admission: RE | Disposition: A | Discharge status: Home / Self Care | Payer: Self-pay | Source: Home / Self Care | Attending: Internal Medicine

## 2022-01-24 ENCOUNTER — Ambulatory Visit (HOSPITAL_BASED_OUTPATIENT_CLINIC_OR_DEPARTMENT_OTHER): Payer: Medicare HMO | Admitting: Anesthesiology

## 2022-01-24 DIAGNOSIS — D122 Benign neoplasm of ascending colon: Secondary | ICD-10-CM | POA: Diagnosis not present

## 2022-01-24 DIAGNOSIS — Z1211 Encounter for screening for malignant neoplasm of colon: Secondary | ICD-10-CM | POA: Diagnosis not present

## 2022-01-24 DIAGNOSIS — D124 Benign neoplasm of descending colon: Secondary | ICD-10-CM | POA: Insufficient documentation

## 2022-01-24 DIAGNOSIS — I10 Essential (primary) hypertension: Secondary | ICD-10-CM | POA: Diagnosis not present

## 2022-01-24 DIAGNOSIS — K648 Other hemorrhoids: Secondary | ICD-10-CM

## 2022-01-24 DIAGNOSIS — I2510 Atherosclerotic heart disease of native coronary artery without angina pectoris: Secondary | ICD-10-CM | POA: Insufficient documentation

## 2022-01-24 DIAGNOSIS — K635 Polyp of colon: Secondary | ICD-10-CM | POA: Diagnosis not present

## 2022-01-24 DIAGNOSIS — D123 Benign neoplasm of transverse colon: Secondary | ICD-10-CM | POA: Diagnosis not present

## 2022-01-24 DIAGNOSIS — Z955 Presence of coronary angioplasty implant and graft: Secondary | ICD-10-CM | POA: Insufficient documentation

## 2022-01-24 DIAGNOSIS — K5730 Diverticulosis of large intestine without perforation or abscess without bleeding: Secondary | ICD-10-CM | POA: Insufficient documentation

## 2022-01-24 DIAGNOSIS — I252 Old myocardial infarction: Secondary | ICD-10-CM | POA: Insufficient documentation

## 2022-01-24 DIAGNOSIS — Z86010 Personal history of colonic polyps: Secondary | ICD-10-CM

## 2022-01-24 DIAGNOSIS — I25119 Atherosclerotic heart disease of native coronary artery with unspecified angina pectoris: Secondary | ICD-10-CM | POA: Diagnosis not present

## 2022-01-24 DIAGNOSIS — D120 Benign neoplasm of cecum: Secondary | ICD-10-CM | POA: Diagnosis not present

## 2022-01-24 DIAGNOSIS — Z79899 Other long term (current) drug therapy: Secondary | ICD-10-CM | POA: Insufficient documentation

## 2022-01-24 HISTORY — PX: COLONOSCOPY WITH PROPOFOL: SHX5780

## 2022-01-24 HISTORY — PX: POLYPECTOMY: SHX5525

## 2022-01-24 SURGERY — COLONOSCOPY WITH PROPOFOL
Anesthesia: General

## 2022-01-24 MED ORDER — LIDOCAINE HCL (CARDIAC) PF 100 MG/5ML IV SOSY
PREFILLED_SYRINGE | INTRAVENOUS | Status: DC | PRN
  Administered 2022-01-24: 50 mg via INTRAVENOUS

## 2022-01-24 MED ORDER — PROPOFOL 10 MG/ML IV BOLUS
INTRAVENOUS | Status: DC | PRN
Start: 2022-01-24 — End: 2022-01-24
  Administered 2022-01-24: 30 mg via INTRAVENOUS
  Administered 2022-01-24: 50 mg via INTRAVENOUS
  Administered 2022-01-24: 40 mg via INTRAVENOUS
  Administered 2022-01-24: 50 mg via INTRAVENOUS
  Administered 2022-01-24: 100 mg via INTRAVENOUS
  Administered 2022-01-24: 30 mg via INTRAVENOUS

## 2022-01-24 MED ORDER — LACTATED RINGERS IV SOLN: INTRAVENOUS | Status: DC

## 2022-01-24 NOTE — Transfer of Care (Signed)
Immediate Anesthesia Transfer of Care Note ? ?Patient: Sophia Briggs ? ?Procedure(s) Performed: COLONOSCOPY WITH PROPOFOL ?POLYPECTOMY ? ?Patient Location: Short Stay ? ?Anesthesia Type:General ? ?Level of Consciousness: drowsy ? ?Airway & Oxygen Therapy: Patient Spontanous Breathing ? ?Post-op Assessment: Report given to RN and Post -op Vital signs reviewed and stable ? ?Post vital signs: Reviewed and stable ? ?Last Vitals:  ?Vitals Value Taken Time  ?BP 95/54 01/24/22 0909  ?Temp 36.6 ?C 01/24/22 0909  ?Pulse 67 01/24/22 0909  ?Resp 12 01/24/22 0909  ?SpO2 99 % 01/24/22 0909  ? ? ?Last Pain:  ?Vitals:  ? 01/24/22 0909  ?TempSrc: Oral  ?PainSc: 0-No pain  ?   ? ?  ? ?Complications: No notable events documented. ?

## 2022-01-24 NOTE — Anesthesia Postprocedure Evaluation (Signed)
Anesthesia Post Note ? ?Patient: [name] ? ?Procedure(s) Performed: COLONOSCOPY WITH PROPOFOL ?POLYPECTOMY ? ?Patient location during evaluation: Phase II ?Anesthesia Type: General ?Level of consciousness: awake and alert and oriented ?Pain management: pain level controlled ?Vital Signs Assessment: post-procedure vital signs reviewed and stable ?Respiratory status: spontaneous breathing, nonlabored ventilation and respiratory function stable ?Cardiovascular status: blood pressure returned to baseline and stable ?Postop Assessment: no apparent nausea or vomiting ?Anesthetic complications: no ? ? ?No notable events documented. ? ? ?Last Vitals:  ?Vitals:  ? 01/24/22 0752 01/24/22 0909  ?BP: 131/72 (!) 95/54  ?Pulse: 67 67  ?Resp: 16 12  ?Temp: 36.7 ?C 36.6 ?C  ?SpO2: 99% 99%  ?  ?Last Pain:  ?Vitals:  ? 01/24/22 0909  ?TempSrc: Oral  ?PainSc: 0-No pain  ? ? ?  ?  ?  ?  ?  ?  ? ?Sophia Briggs C Sophia Briggs ? ? ? ? ?

## 2022-01-24 NOTE — Discharge Instructions (Signed)
?  Colonoscopy Discharge Instructions  Read the instructions outlined below and refer to this sheet in the next few weeks. These discharge instructions provide you with general information on caring for yourself after you leave the hospital. Your doctor may also give you specific instructions. While your treatment has been planned according to the most current medical practices available, unavoidable complications occasionally occur.   ACTIVITY You may resume your regular activity, but move at a slower pace for the next 24 hours.  Take frequent rest periods for the next 24 hours.  Walking will help get rid of the air and reduce the bloated feeling in your belly (abdomen).  No driving for 24 hours (because of the medicine (anesthesia) used during the test).   Do not sign any important legal documents or operate any machinery for 24 hours (because of the anesthesia used during the test).  NUTRITION Drink plenty of fluids.  You may resume your normal diet as instructed by your doctor.  Begin with a light meal and progress to your normal diet. Heavy or fried foods are harder to digest and may make you feel sick to your stomach (nauseated).  Avoid alcoholic beverages for 24 hours or as instructed.  MEDICATIONS You may resume your normal medications unless your doctor tells you otherwise.  WHAT YOU CAN EXPECT TODAY Some feelings of bloating in the abdomen.  Passage of more gas than usual.  Spotting of blood in your stool or on the toilet paper.  IF YOU HAD POLYPS REMOVED DURING THE COLONOSCOPY: No aspirin products for 7 days or as instructed.  No alcohol for 7 days or as instructed.  Eat a soft diet for the next 24 hours.  FINDING OUT THE RESULTS OF YOUR TEST Not all test results are available during your visit. If your test results are not back during the visit, make an appointment with your caregiver to find out the results. Do not assume everything is normal if you have not heard from your  caregiver or the medical facility. It is important for you to follow up on all of your test results.  SEEK IMMEDIATE MEDICAL ATTENTION IF: You have more than a spotting of blood in your stool.  Your belly is swollen (abdominal distention).  You are nauseated or vomiting.  You have a temperature over 101.  You have abdominal pain or discomfort that is severe or gets worse throughout the day.   Your colonoscopy revealed 9 polyp(s) which I removed successfully. Await pathology results, my office will contact you. I recommend repeating colonoscopy in 3 years for surveillance purposes.   You also have diverticulosis and internal hemorrhoids. I would recommend increasing fiber in your diet or adding OTC Benefiber/Metamucil. Be sure to drink at least 4 to 6 glasses of water daily. Follow-up with GI as needed.   I hope you have a great rest of your week!  Khyler Urda K. Taziah Difatta, D.O. Gastroenterology and Hepatology [facility]  

## 2022-01-24 NOTE — Op Note (Signed)
[facility] ?Patient Name: Sophia Briggs ?Procedure Date: 01/24/2022 8:38 AM ?MRN: [id number] ?Date of Birth: 08-24-1946 ?Attending MD: Elon Alas. Abbey Chatters , DO ?CSN: [id number] ?Age: 76 ?Admit Type: Outpatient ?Procedure:                Colonoscopy ?Indications:              High risk colon cancer surveillance: Personal  ?                          history of colonic polyps ?Providers:                Elon Alas. Abbey Chatters, DO, Tammy Vaught, RN, Nelma Rothman,  ?                          Technician ?Referring MD:              ?Medicines:                See the Anesthesia note for documentation of the  ?                          administered medications ?Complications:            No immediate complications. ?Estimated Blood Loss:     Estimated blood loss was minimal. ?Procedure:                Pre-Anesthesia Assessment: ?                          - The anesthesia plan was to use monitored  ?                          anesthesia care (MAC). ?                          After obtaining informed consent, the colonoscope  ?                          was passed under direct vision. Throughout the  ?                          procedure, the patient's blood pressure, pulse, and  ?                          oxygen saturations were monitored continuously. The  ?                          PCF-HQ190L ([id number]) scope was introduced through  ?                          the anus and advanced to the the cecum, identified  ?                          by appendiceal orifice and ileocecal valve. The  ?                          colonoscopy was performed without difficulty. The  ?  patient tolerated the procedure well. The quality  ?                          of the bowel preparation was evaluated using the  ?                          BBPS [facility] Bowel Preparation Scale) with scores  ?                          of: Right Colon = 2 (minor amount of residual  ?                          staining, small fragments of stool and/or  opaque  ?                          liquid, but mucosa seen well), Transverse Colon = 2  ?                          (minor amount of residual staining, small fragments  ?                          of stool and/or opaque liquid, but mucosa seen  ?                          well) and Left Colon = 2 (minor amount of residual  ?                          staining, small fragments of stool and/or opaque  ?                          liquid, but mucosa seen well). The total BBPS score  ?                          equals 6. The quality of the bowel preparation was  ?                          fair. ?Scope In: 8:48:18 AM ?Scope Out: 9:07:02 AM ?Scope Withdrawal Time: 0 hours 13 minutes 35 seconds  ?Total Procedure Duration: 0 hours 18 minutes 44 seconds  ?Findings: ?     The perianal and digital rectal examinations were normal. ?     Non-bleeding internal hemorrhoids were found during endoscopy. ?     Multiple small and large-mouthed diverticula were found in the sigmoid  ?     colon and descending colon. ?     Five sessile polyps were found in the ascending colon and cecum. The  ?     polyps were 4 to 7 mm in size. These polyps were removed with a cold  ?     snare. Resection and retrieval were complete. ?     Four sessile polyps were found in the descending colon and transverse  ?     colon. The polyps were 4 to 6 mm in size. These polyps were removed with  ?     a cold snare. Resection and  retrieval were complete. ?Impression:               - Preparation of the colon was fair. ?                          - Non-bleeding internal hemorrhoids. ?                          - Diverticulosis in the sigmoid colon and in the  ?                          descending colon. ?                          - Five 4 to 7 mm polyps in the ascending colon and  ?                          in the cecum, removed with a cold snare. Resected  ?                          and retrieved. ?                          - Four 4 to 6 mm polyps in the descending colon  and  ?                          in the transverse colon, removed with a cold snare.  ?                          Resected and retrieved. ?Moderate Sedation: ?     Per Anesthesia Care ?Recommendation:           - Patient has a contact number available for  ?                          emergencies. The signs and symptoms of potential  ?                          delayed complications were discussed with the  ?                          patient. Return to normal activities tomorrow.  ?                          Written discharge instructions were provided to the  ?                          patient. ?                          - Resume previous diet. ?                          - Continue present medications. ?                          -  Await pathology results. ?                          - Repeat colonoscopy in 3 years for surveillance. ?                          - Return to GI clinic PRN. ?Procedure Code(s):        --- Professional --- ?                          [phone], Colonoscopy, flexible; with removal of  ?                          tumor(s), polyp(s), or other lesion(s) by snare  ?                          technique ?Diagnosis Code(s):        --- Professional --- ?                          Z86.010, Personal history of colonic polyps ?                          K64.8, Other hemorrhoids ?                          K63.5, Polyp of colon ?                          K57.30, Diverticulosis of large intestine without  ?                          perforation or abscess without bleeding ?CPT copyright 2019 American Medical Association. All rights reserved. ?The codes documented in this report are preliminary and upon coder review may  ?be revised to meet current compliance requirements. ?Elon Alas. Abbey Chatters, DO ?Elon Alas. Abbey Chatters, DO ?01/24/2022 9:10:18 AM ?This report has been signed electronically. ?Number of Addenda: 0 ?

## 2022-01-24 NOTE — Anesthesia Preprocedure Evaluation (Signed)
Anesthesia Evaluation  ?Patient identified by MRN, date of birth, ID band ?Patient awake ? ? ? ?Reviewed: ?Allergy & Precautions, NPO status , Patient's Chart, lab work & pertinent test results, reviewed documented beta blocker date and time  ? ?Airway ?Mallampati: II ? ?TM Distance: >3 FB ?Neck ROM: Full ? ? ? Dental ? ?(+) Missing, Poor Dentition, Chipped, Dental Advisory Given ?  ?Pulmonary ?COPD, Current Smoker and Patient abstained from smoking.,  ?  ?Pulmonary exam normal ?breath sounds clear to auscultation ? ? ? ? ? ? Cardiovascular ?Exercise Tolerance: Good ?hypertension, Pt. on home beta blockers and Pt. on medications ?+ angina + CAD, + Past MI and + Cardiac Stents  ? ?Rhythm:Irregular Rate:Normal ? ? ?  ?Neuro/Psych ?PSYCHIATRIC DISORDERS Anxiety TIA Neuromuscular disease CVA   ? GI/Hepatic ?Neg liver ROS, GERD  Medicated and Controlled,  ?Endo/Other  ?negative endocrine ROS ? Renal/GU ?negative Renal ROS  ?negative genitourinary ?  ?Musculoskeletal ? ?(+) Arthritis , Osteoarthritis,   ? Abdominal ?  ?Peds ?negative pediatric ROS ?(+)  Hematology ?negative hematology ROS ?(+)   ?Anesthesia Other Findings ? ? Reproductive/Obstetrics ?negative OB ROS ? ?  ? ? ? ? ? ? ? ? ? ? ? ? ? ?  ?  ? ? ? ? ? ? ? ?Anesthesia Physical ?Anesthesia Plan ? ?ASA: 3 ? ?Anesthesia Plan: General  ? ?Post-op Pain Management: Minimal or no pain anticipated  ? ?Induction: Intravenous ? ?PONV Risk Score and Plan: Propofol infusion ? ?Airway Management Planned: Nasal Cannula and Natural Airway ? ?Additional Equipment:  ? ?Intra-op Plan:  ? ?Post-operative Plan:  ? ?Informed Consent: I have reviewed the patients History and Physical, chart, labs and discussed the procedure including the risks, benefits and alternatives for the proposed anesthesia with the patient or authorized representative who has indicated his/her understanding and acceptance.  ? ? ? ?Dental advisory given ? ?Plan Discussed with:  CRNA and Surgeon ? ?Anesthesia Plan Comments:   ? ? ? ? ? ? ?Anesthesia Quick Evaluation ? ?

## 2022-01-24 NOTE — H&P (Signed)
Primary Care Physician:  Carrolyn Meiers, MD ?Primary Gastroenterologist:  Dr. Abbey Chatters ? ?Pre-Procedure History & Physical: ?HPI:  Sophia Briggs is a 76 y.o. female is here for a colonoscopy to be performed for surveillance purposes,personal history of adenomatous colon polyps.  ? ?Past Medical History:  ?Diagnosis Date  ? Acute MI ([place])   ? Allergy   ? Anxiety   ? Arthritis   ? all over  ? Back pain   ? COPD (chronic obstructive pulmonary disease) ([place])   ? Coronary artery disease   ? CTS (carpal tunnel syndrome) 01/06/2012  ? GERD (gastroesophageal reflux disease)   ? Hyperlipidemia   ? Hypertension   ? Stroke [facility])   ? Syncope and collapse   ? TIA (transient ischemic attack)   ? ? ?Past Surgical History:  ?Procedure Laterality Date  ? CARDIAC CATHETERIZATION    ? stent placement 2006 at [facility]   ? COLONOSCOPY N/A 06/08/2018  ? Procedure: COLONOSCOPY;  Surgeon: Danie Binder, MD;  [facility];  Service: Endoscopy;  Laterality: N/A;  12:15pm  ? COLONOSCOPY WITH PROPOFOL N/A 04/27/2021  ? Procedure: COLONOSCOPY WITH PROPOFOL;  Surgeon: Eloise Harman, DO;  Location: [facility];  Service: Endoscopy;  Laterality: N/A;  ASA II / 12:30  ? PAROTIDECTOMY    ? POLYPECTOMY  06/08/2018  ? Procedure: POLYPECTOMY;  Surgeon: Danie Binder, MD;  Location: [facility];  Service: Endoscopy;;  cecal polyps times 6 ( 4cs, 2 hs), ascending colon polyps ( 2cs, 2hs) hepatic flexure polyps (4 cs, 5 hs), transverse colon polyps  splenic flexure polyp times 2 cs, 2 cb  ? POLYPECTOMY  04/27/2021  ? Procedure: POLYPECTOMY;  Surgeon: Eloise Harman, DO;  [facility];  Service: Endoscopy;;  ? TUBAL LIGATION    ? ? ?Prior to Admission medications   ?Medication Sig Start Date End Date Taking? Authorizing Provider  ?carvedilol (COREG) 25 MG tablet Take 1 tablet (25 mg total) by mouth 2 (two) times daily with a meal. 09/21/21  Yes Gollan, Kathlene November, MD  ?cholecalciferol (VITAMIN D) 1000  UNITS tablet Take 2,000 Units by mouth daily.   Yes [provider]  ?hydrOXYzine (ATARAX/VISTARIL) 25 MG tablet Take 25 mg by mouth 3 (three) times daily as needed for anxiety.   Yes [provider]  ?isosorbide mononitrate (IMDUR) 30 MG 24 hr tablet Take 1 tablet (30 mg total) by mouth daily. 09/21/21  Yes Minna Merritts, MD  ?losartan-hydrochlorothiazide (HYZAAR) 100-25 MG tablet Take 1 tablet by mouth daily. 09/21/21  Yes Minna Merritts, MD  ?pantoprazole (PROTONIX) 40 MG tablet Take 1 tablet (40 mg total) by mouth daily. 09/21/21  Yes Minna Merritts, MD  ?polyethylene glycol-electrolytes (TRILYTE) 420 g solution Take 4,000 mLs by mouth as directed. 01/03/22  Yes Eloise Harman, DO  ?aspirin 81 MG tablet Take 162 mg by mouth at bedtime.  09/17/08   [provider]  ?atorvastatin (LIPITOR) 40 MG tablet Take 1 tablet (40 mg total) by mouth daily. 09/21/21   Minna Merritts, MD  ?nitroGLYCERIN (NITROSTAT) 0.4 MG SL tablet DISSOLVE ONE TABLET UNDER THE TONGUE EVERY 5 MINUTES AS NEEDED FOR CHEST PAIN.  DO NOT EXCEED A TOTAL OF 3 DOSES IN 15 MINUTES 09/21/21   Minna Merritts, MD  ? ? ?Allergies as of 12/22/2021 - Review Complete 12/22/2021  ?Allergen Reaction Noted  ? Amlodipine Other (See Comments) 05/06/2013  ? Atorvastatin Other (See  Comments) 08/05/2015  ? Clopidogrel bisulfate Other (See Comments) 08/05/2015  ? Linzess [linaclotide] Rash 08/07/2018  ? ? ?Family History  ?Adopted: Yes  ?Problem Relation Age of Onset  ? Arthritis Mother   ? Hypertension Mother   ? Arthritis Father   ? Cancer Father   ?     prostate  ? Early death Son   ?     MVA  ? Heart disease Brother 56  ? Aneurysm Sister   ? Colon cancer Neg Hx   ? ? ?Social History  ? ?Socioeconomic History  ? Marital status: Single  ?  Spouse name: Not on file  ? Number of children: Not on file  ? Years of education: Not on file  ? Highest education level: Not on file  ?Occupational History  ? Not on file  ?Tobacco  Use  ? Smoking status: Some Days  ?  Packs/day: 0.10  ?  Years: 10.00  ?  Pack years: 1.00  ?  Types: Cigarettes  ? Smokeless tobacco: Never  ? Tobacco comments:  ?  up to 5 cigarettes per day  ?Vaping Use  ? Vaping Use: Never used  ?Substance and Sexual Activity  ? Alcohol use: Yes  ?  Comment: social  ? Drug use: Yes  ?  Frequency: 7.0 times per week  ?  Types: Marijuana  ?  Comment: daily  ? Sexual activity: Yes  ?Other Topics Concern  ? Not on file  ?Social History Narrative  ? Not on file  ? ?Social Determinants of Health  ? ?Financial Resource Strain: Not on file  ?Food Insecurity: Not on file  ?Transportation Needs: Not on file  ?Physical Activity: Not on file  ?Stress: Not on file  ?Social Connections: Not on file  ?Intimate Partner Violence: Not on file  ? ? ?Review of Systems: ?See HPI, otherwise negative ROS ? ?Physical Exam: ?Vital signs in last 24 hours: ?Temp:  [98 ?F (36.7 ?C)] 98 ?F (36.7 ?C) (04/24 8115) ?Pulse Rate:  [67] 67 (04/24 0752) ?Resp:  [16] 16 (04/24 0752) ?BP: (131)/(72) 131/72 (04/24 0752) ?SpO2:  [99 %] 99 % (04/24 0752) ?  ?General:   Alert,  Well-developed, well-nourished, pleasant and cooperative in NAD ?Head:  Normocephalic and atraumatic. ?Eyes:  Sclera clear, no icterus.   Conjunctiva pink. ?Ears:  Normal auditory acuity. ?Nose:  No deformity, discharge,  or lesions. ?Mouth:  No deformity or lesions, dentition normal. ?Neck:  Supple; no masses or thyromegaly. ?Lungs:  Clear throughout to auscultation.   No wheezes, crackles, or rhonchi. No acute distress. ?Heart:  Regular rate and rhythm; no murmurs, clicks, rubs,  or gallops. ?Abdomen:  Soft, nontender and nondistended. No masses, hepatosplenomegaly or hernias noted. Normal bowel sounds, without guarding, and without rebound.   ?Msk:  Symmetrical without gross deformities. Normal posture. ?Extremities:  Without clubbing or edema. ?Neurologic:  Alert and  oriented x4;  grossly normal neurologically. ?Skin:  Intact without  significant lesions or rashes. ?Cervical Nodes:  No significant cervical adenopathy. ?Psych:  Alert and cooperative. Normal mood and affect. ? ?Impression/Plan: ?GINI CAPUTO is here for a colonoscopy to be performed for surveillance purposes,personal history of adenomatous colon polyps.  ? ?The risks of the procedure including infection, bleed, or perforation as well as benefits, limitations, alternatives and imponderables have been reviewed with the patient. Questions have been answered. All parties agreeable. ? ? ?

## 2022-01-25 LAB — SURGICAL PATHOLOGY

## 2022-01-26 ENCOUNTER — Encounter (HOSPITAL_COMMUNITY): Payer: Self-pay | Admitting: Internal Medicine

## 2022-01-30 DIAGNOSIS — I10 Essential (primary) hypertension: Secondary | ICD-10-CM | POA: Diagnosis not present

## 2022-01-30 DIAGNOSIS — I2510 Atherosclerotic heart disease of native coronary artery without angina pectoris: Secondary | ICD-10-CM | POA: Diagnosis not present

## 2022-02-10 DIAGNOSIS — M1990 Unspecified osteoarthritis, unspecified site: Secondary | ICD-10-CM | POA: Diagnosis not present

## 2022-02-10 DIAGNOSIS — I10 Essential (primary) hypertension: Secondary | ICD-10-CM | POA: Diagnosis not present

## 2022-03-01 DIAGNOSIS — I10 Essential (primary) hypertension: Secondary | ICD-10-CM | POA: Diagnosis not present

## 2022-03-01 DIAGNOSIS — M1990 Unspecified osteoarthritis, unspecified site: Secondary | ICD-10-CM | POA: Diagnosis not present

## 2022-04-01 DIAGNOSIS — I10 Essential (primary) hypertension: Secondary | ICD-10-CM | POA: Diagnosis not present

## 2022-04-01 DIAGNOSIS — I2510 Atherosclerotic heart disease of native coronary artery without angina pectoris: Secondary | ICD-10-CM | POA: Diagnosis not present

## 2022-04-07 ENCOUNTER — Other Ambulatory Visit (HOSPITAL_COMMUNITY): Payer: Self-pay | Admitting: Internal Medicine

## 2022-04-07 DIAGNOSIS — Z1231 Encounter for screening mammogram for malignant neoplasm of breast: Secondary | ICD-10-CM

## 2022-04-11 ENCOUNTER — Inpatient Hospital Stay (HOSPITAL_COMMUNITY): Admission: RE | Admit: 2022-04-11 | Payer: Medicare HMO | Source: Ambulatory Visit

## 2022-05-01 DIAGNOSIS — M1990 Unspecified osteoarthritis, unspecified site: Secondary | ICD-10-CM | POA: Diagnosis not present

## 2022-05-01 DIAGNOSIS — I10 Essential (primary) hypertension: Secondary | ICD-10-CM | POA: Diagnosis not present

## 2022-06-06 IMAGING — MG MM DIGITAL SCREENING BILAT W/ TOMO AND CAD
8 series · 9 of 24 positions shown · non-contrast
Comparison: Previous exam(s).

CLINICAL DATA: Screening.

EXAM:
DIGITAL SCREENING BILATERAL MAMMOGRAM WITH TOMOSYNTHESIS AND CAD
TECHNIQUE: Bilateral screening digital craniocaudal and mediolateral oblique
mammograms were obtained. Bilateral screening digital breast
tomosynthesis was performed. The images were evaluated with
computer-aided detection.

[R CC synth-2D]
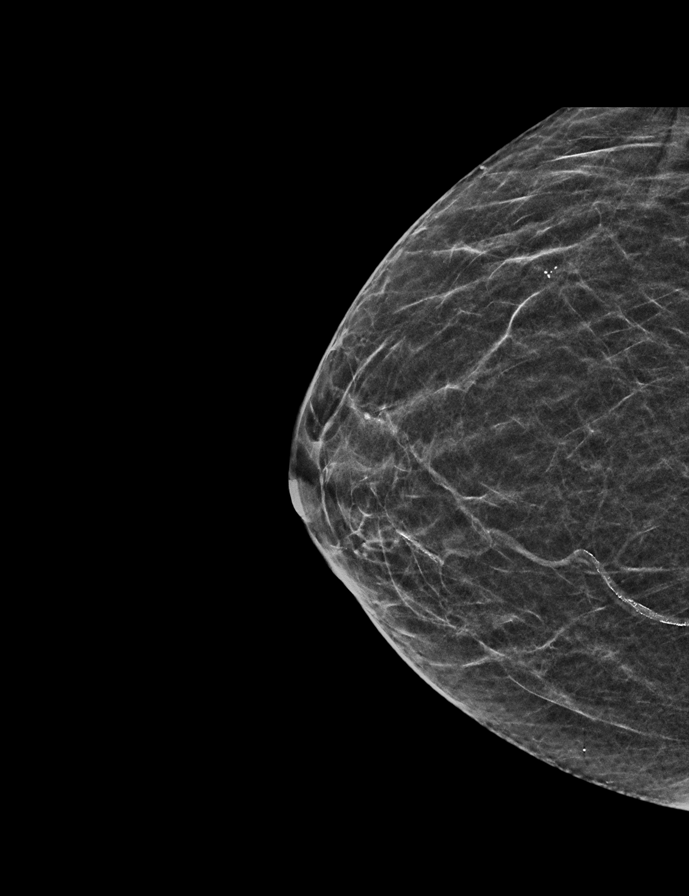

[L MLO synth-2D]
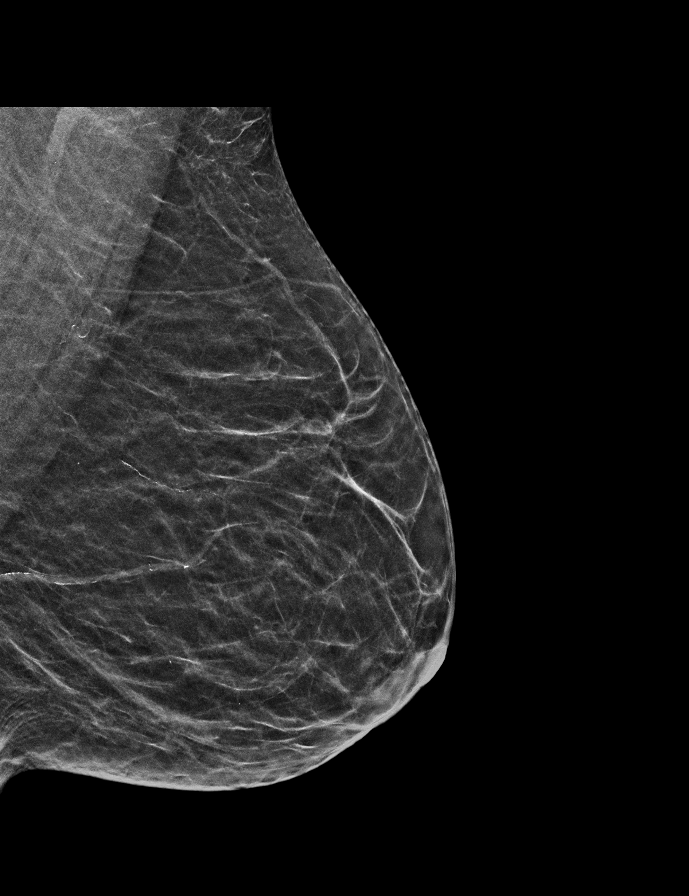

[R MLO synth-2D]
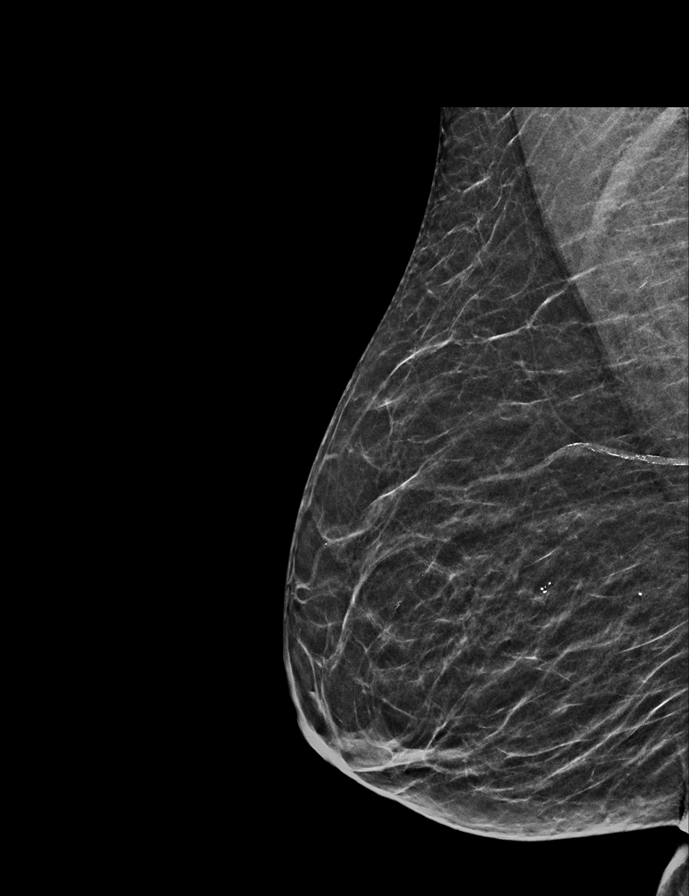

[L CC synth-2D]
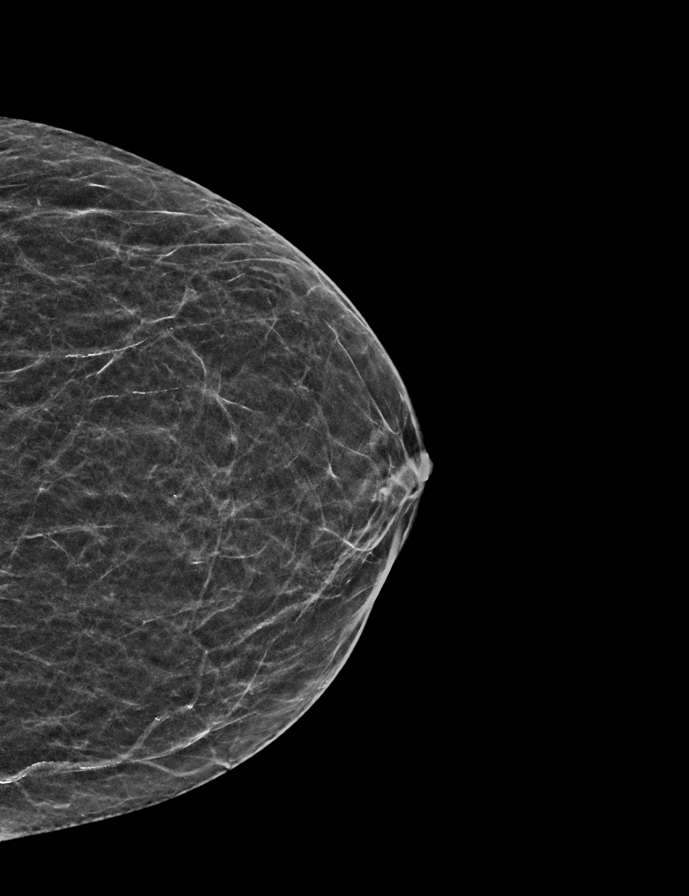

[R CC tomo · 2 of 39 frames shown]
[frame 13/39]
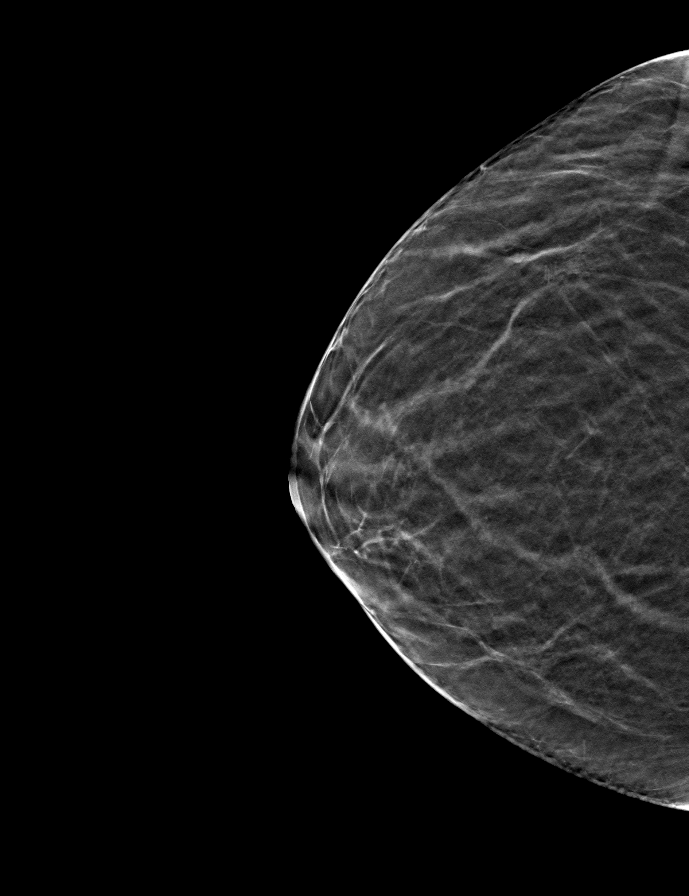
[frame 20/39]
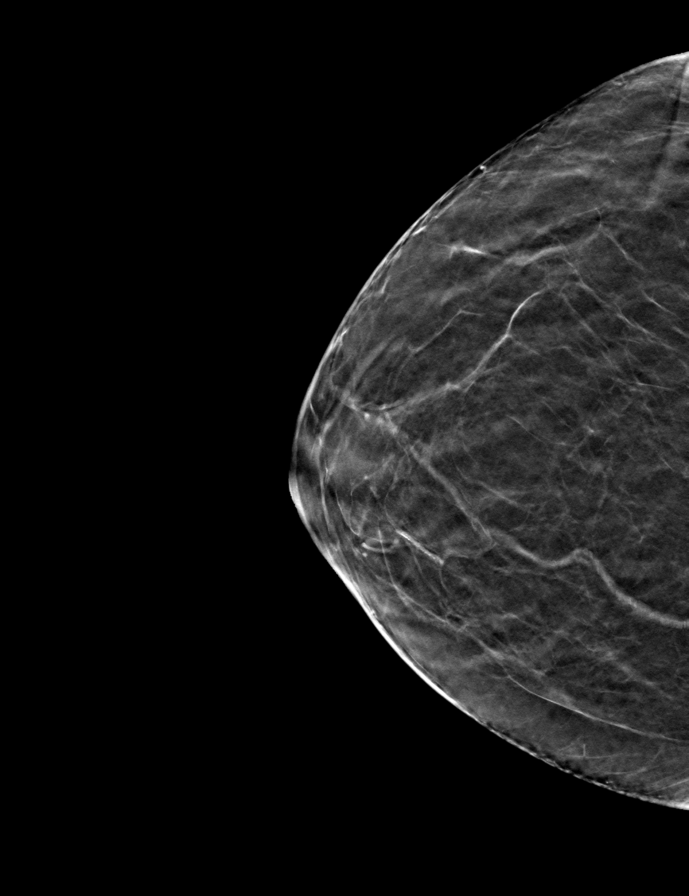

[R MLO tomo · tomo slice 21/40.0]
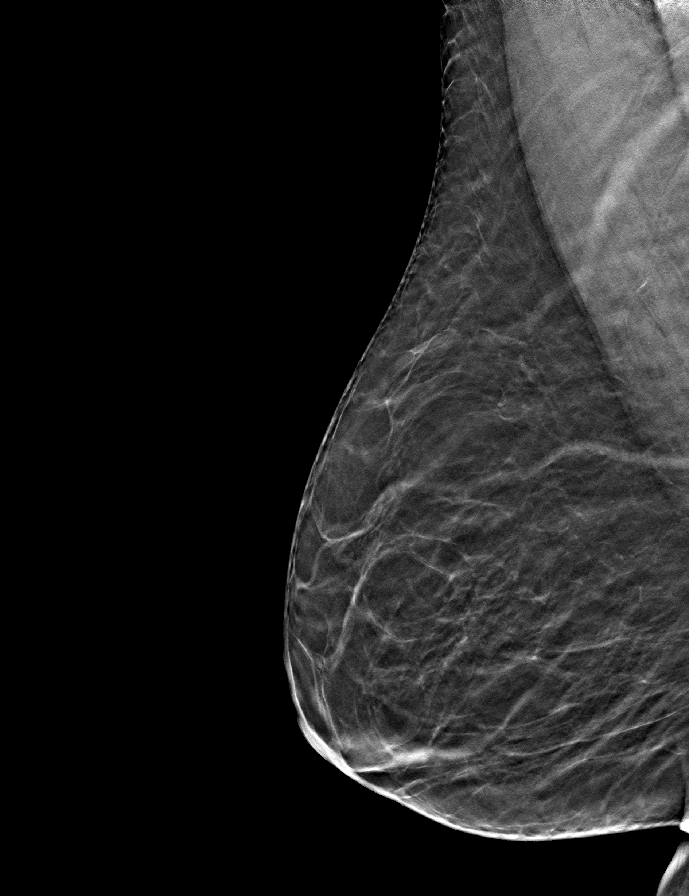

[L MLO tomo · tomo slice 21/42.0]
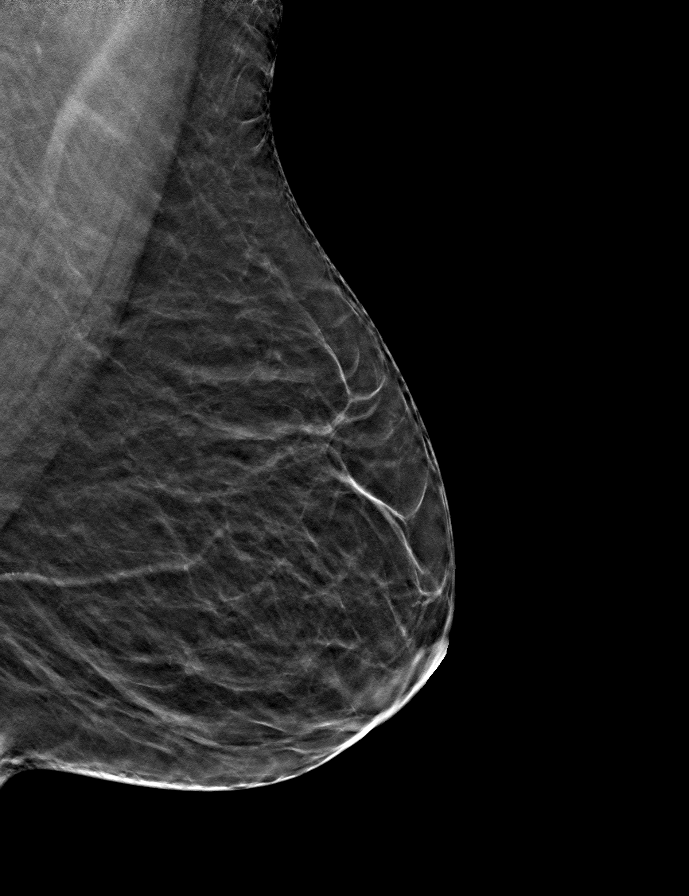

[L CC tomo · tomo slice 19/37.0]
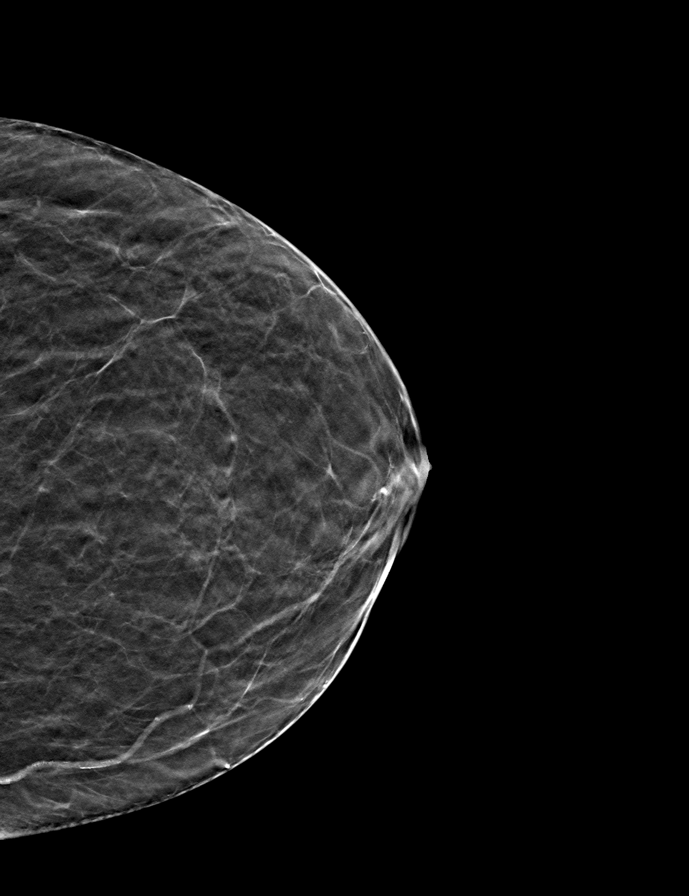

[9 of 24 positions shown; findings below may reference images not displayed]

ACR Breast Density Category b: There are scattered areas of
fibroglandular density.
FINDINGS: There are no findings suspicious for malignancy. The images were
evaluated with computer-aided detection.
IMPRESSION: No mammographic evidence of malignancy. A result letter of this
screening mammogram will be mailed directly to the patient.

RECOMMENDATION:
Screening mammogram in one year. (Code:[id number])

BI-RADS CATEGORY  1: Negative.

## 2022-06-15 DIAGNOSIS — K219 Gastro-esophageal reflux disease without esophagitis: Secondary | ICD-10-CM | POA: Diagnosis not present

## 2022-06-15 DIAGNOSIS — M1990 Unspecified osteoarthritis, unspecified site: Secondary | ICD-10-CM | POA: Diagnosis not present

## 2022-06-15 DIAGNOSIS — I10 Essential (primary) hypertension: Secondary | ICD-10-CM | POA: Diagnosis not present

## 2022-06-15 DIAGNOSIS — Z23 Encounter for immunization: Secondary | ICD-10-CM | POA: Diagnosis not present

## 2022-06-15 DIAGNOSIS — I2510 Atherosclerotic heart disease of native coronary artery without angina pectoris: Secondary | ICD-10-CM | POA: Diagnosis not present

## 2022-07-15 DIAGNOSIS — I2510 Atherosclerotic heart disease of native coronary artery without angina pectoris: Secondary | ICD-10-CM | POA: Diagnosis not present

## 2022-07-15 DIAGNOSIS — I10 Essential (primary) hypertension: Secondary | ICD-10-CM | POA: Diagnosis not present

## 2022-08-15 DIAGNOSIS — I2510 Atherosclerotic heart disease of native coronary artery without angina pectoris: Secondary | ICD-10-CM | POA: Diagnosis not present

## 2022-08-15 DIAGNOSIS — I10 Essential (primary) hypertension: Secondary | ICD-10-CM | POA: Diagnosis not present

## 2022-09-14 DIAGNOSIS — I2510 Atherosclerotic heart disease of native coronary artery without angina pectoris: Secondary | ICD-10-CM | POA: Diagnosis not present

## 2022-09-14 DIAGNOSIS — I10 Essential (primary) hypertension: Secondary | ICD-10-CM | POA: Diagnosis not present

## 2022-10-04 DIAGNOSIS — H524 Presbyopia: Secondary | ICD-10-CM | POA: Diagnosis not present

## 2022-10-04 DIAGNOSIS — H5213 Myopia, bilateral: Secondary | ICD-10-CM | POA: Diagnosis not present

## 2022-10-04 DIAGNOSIS — H52223 Regular astigmatism, bilateral: Secondary | ICD-10-CM | POA: Diagnosis not present

## 2022-10-15 DIAGNOSIS — M1990 Unspecified osteoarthritis, unspecified site: Secondary | ICD-10-CM | POA: Diagnosis not present

## 2022-10-15 DIAGNOSIS — I10 Essential (primary) hypertension: Secondary | ICD-10-CM | POA: Diagnosis not present

## 2022-11-15 DIAGNOSIS — M1990 Unspecified osteoarthritis, unspecified site: Secondary | ICD-10-CM | POA: Diagnosis not present

## 2022-11-15 DIAGNOSIS — I10 Essential (primary) hypertension: Secondary | ICD-10-CM | POA: Diagnosis not present

## 2022-11-17 ENCOUNTER — Ambulatory Visit (HOSPITAL_COMMUNITY)
Admission: RE | Admit: 2022-11-17 | Discharge: 2022-11-17 | Disposition: A | Discharge status: Home / Self Care | Payer: Medicare HMO | Source: Ambulatory Visit | Attending: Internal Medicine | Admitting: Internal Medicine

## 2022-11-17 DIAGNOSIS — Z1231 Encounter for screening mammogram for malignant neoplasm of breast: Secondary | ICD-10-CM | POA: Diagnosis not present

## 2022-12-07 DIAGNOSIS — I10 Essential (primary) hypertension: Secondary | ICD-10-CM | POA: Diagnosis not present

## 2022-12-07 DIAGNOSIS — M1990 Unspecified osteoarthritis, unspecified site: Secondary | ICD-10-CM | POA: Diagnosis not present

## 2022-12-07 DIAGNOSIS — Z1331 Encounter for screening for depression: Secondary | ICD-10-CM | POA: Diagnosis not present

## 2022-12-07 DIAGNOSIS — Z1389 Encounter for screening for other disorder: Secondary | ICD-10-CM | POA: Diagnosis not present

## 2022-12-07 DIAGNOSIS — I2510 Atherosclerotic heart disease of native coronary artery without angina pectoris: Secondary | ICD-10-CM | POA: Diagnosis not present

## 2022-12-07 DIAGNOSIS — F17200 Nicotine dependence, unspecified, uncomplicated: Secondary | ICD-10-CM | POA: Diagnosis not present

## 2022-12-07 DIAGNOSIS — R69 Illness, unspecified: Secondary | ICD-10-CM | POA: Diagnosis not present

## 2022-12-07 DIAGNOSIS — F17210 Nicotine dependence, cigarettes, uncomplicated: Secondary | ICD-10-CM | POA: Diagnosis not present

## 2022-12-07 DIAGNOSIS — K219 Gastro-esophageal reflux disease without esophagitis: Secondary | ICD-10-CM | POA: Diagnosis not present

## 2022-12-07 DIAGNOSIS — Z0001 Encounter for general adult medical examination with abnormal findings: Secondary | ICD-10-CM | POA: Diagnosis not present

## 2022-12-08 DIAGNOSIS — K219 Gastro-esophageal reflux disease without esophagitis: Secondary | ICD-10-CM | POA: Diagnosis not present

## 2022-12-08 DIAGNOSIS — Z79899 Other long term (current) drug therapy: Secondary | ICD-10-CM | POA: Diagnosis not present

## 2022-12-08 DIAGNOSIS — I10 Essential (primary) hypertension: Secondary | ICD-10-CM | POA: Diagnosis not present

## 2022-12-08 DIAGNOSIS — Z0001 Encounter for general adult medical examination with abnormal findings: Secondary | ICD-10-CM | POA: Diagnosis not present

## 2022-12-08 DIAGNOSIS — Z8673 Personal history of transient ischemic attack (TIA), and cerebral infarction without residual deficits: Secondary | ICD-10-CM | POA: Diagnosis not present

## 2023-01-07 DIAGNOSIS — I10 Essential (primary) hypertension: Secondary | ICD-10-CM | POA: Diagnosis not present

## 2023-01-07 DIAGNOSIS — M1990 Unspecified osteoarthritis, unspecified site: Secondary | ICD-10-CM | POA: Diagnosis not present

## 2023-02-06 DIAGNOSIS — M1990 Unspecified osteoarthritis, unspecified site: Secondary | ICD-10-CM | POA: Diagnosis not present

## 2023-02-06 DIAGNOSIS — I10 Essential (primary) hypertension: Secondary | ICD-10-CM | POA: Diagnosis not present

## 2023-03-09 DIAGNOSIS — M1990 Unspecified osteoarthritis, unspecified site: Secondary | ICD-10-CM | POA: Diagnosis not present

## 2023-03-09 DIAGNOSIS — I10 Essential (primary) hypertension: Secondary | ICD-10-CM | POA: Diagnosis not present

## 2023-03-21 DIAGNOSIS — H2513 Age-related nuclear cataract, bilateral: Secondary | ICD-10-CM | POA: Diagnosis not present

## 2023-04-08 DIAGNOSIS — I10 Essential (primary) hypertension: Secondary | ICD-10-CM | POA: Diagnosis not present

## 2023-04-08 DIAGNOSIS — M1990 Unspecified osteoarthritis, unspecified site: Secondary | ICD-10-CM | POA: Diagnosis not present

## 2023-04-14 DIAGNOSIS — R8279 Other abnormal findings on microbiological examination of urine: Secondary | ICD-10-CM | POA: Diagnosis not present

## 2023-04-14 DIAGNOSIS — R8290 Unspecified abnormal findings in urine: Secondary | ICD-10-CM | POA: Diagnosis not present

## 2023-05-09 DIAGNOSIS — M1990 Unspecified osteoarthritis, unspecified site: Secondary | ICD-10-CM | POA: Diagnosis not present

## 2023-05-09 DIAGNOSIS — H04123 Dry eye syndrome of bilateral lacrimal glands: Secondary | ICD-10-CM | POA: Diagnosis not present

## 2023-05-09 DIAGNOSIS — I10 Essential (primary) hypertension: Secondary | ICD-10-CM | POA: Diagnosis not present

## 2023-05-09 DIAGNOSIS — H25813 Combined forms of age-related cataract, bilateral: Secondary | ICD-10-CM | POA: Diagnosis not present

## 2023-06-08 DIAGNOSIS — I2510 Atherosclerotic heart disease of native coronary artery without angina pectoris: Secondary | ICD-10-CM | POA: Diagnosis not present

## 2023-06-08 DIAGNOSIS — Z23 Encounter for immunization: Secondary | ICD-10-CM | POA: Diagnosis not present

## 2023-06-08 DIAGNOSIS — M1990 Unspecified osteoarthritis, unspecified site: Secondary | ICD-10-CM | POA: Diagnosis not present

## 2023-06-08 DIAGNOSIS — K219 Gastro-esophageal reflux disease without esophagitis: Secondary | ICD-10-CM | POA: Diagnosis not present

## 2023-06-08 DIAGNOSIS — F17210 Nicotine dependence, cigarettes, uncomplicated: Secondary | ICD-10-CM | POA: Diagnosis not present

## 2023-06-08 DIAGNOSIS — F17200 Nicotine dependence, unspecified, uncomplicated: Secondary | ICD-10-CM | POA: Diagnosis not present

## 2023-06-08 DIAGNOSIS — I10 Essential (primary) hypertension: Secondary | ICD-10-CM | POA: Diagnosis not present

## 2023-06-08 NOTE — H&P (Signed)
Surgical History & Physical  Patient Name: Sophia Briggs  DOB: 1946/08/06  Surgery: Cataract extraction with intraocular lens implant phacoemulsification; Right Eye Surgeon: Pecolia Ades MD Surgery Date: 06/16/2023 Pre-Op Date: 05/09/2023  HPI: A 89 Yr. old female patient present cataract eval per Dr. [name]. 1. The patient complains of difficulty when driving at night due to glare and during the day due to bright sunlight, difficulties reading fine print, reading the captions on the TV, which began 1 year ago. Both eyes are affected, OD>OS. The episode is constant. The condition's severity is worsening. This is negatively affecting the patient's quality of life and the patient is unable to function adequately in life with the current level of vision.  Medical History: Cataracts  Arthritis Heart Problem High Blood Pressure Stroke  Review of Systems Cardiovascular High Blood Pressure All recorded systems are negative except as noted above.  Social Current every day smoker of Cigarettes   Medication atorvastatin ,  amlodipine ,  ketoconazole ,  ibuprofen ,  isosorbide mononitrate ,  nitroglycerin ,  carvedilol ,  losartan ,  pantoprazole ,  aspirin   Sx/Procedures Heart Stents  Drug Allergies  amoxicillin   History & Physical: Heent: cataracts NECK: supple without bruits LUNGS: lungs clear to auscultation CV: regular rate and rhythm Abdomen: soft and non-tender  Impression & Plan: Assessment: 1.  CATARACT AGE-RELATED COMBINED FORMS; Both Eyes (H25.813) 2.  Myopia ; Both Eyes (H52.13) 3.  DRY EYE SYNDROME/TEAR FILM INSUFFICIENCY; Both Eyes (H04.123)  Plan: 1.  Cataracts are visually significant and account for the patient's complaints. Discussed all risks, benefits, procedures and recovery, including infection, loss of vision and eye, need for glasses after surgery or additional procedures. Patient understands changing glasses will not improve vision. Patient  indicated understanding of procedure. All questions answered. Patient desires to have surgery, recommend phacoemulsification with intraocular lens. Patient to have preliminary testing necessary (Argos/IOL Master, Mac OCT, TOPO) Educational materials provided.  Plan: - Proceed with cataract surgery OD, followed by OS - best distance vision OU, DIB00 - AL difference agrees with MRx difference - Discussed need for glasses due to astigmatism afterward - otherwise healthy macula and cornea  2.  Per Dr. [name]  3.  Findings, prognosis and treatment options reviewed. Begin basic treatment regimen; warm compresses, lid scrubs, 1000-2000mg  omega 3 fatty acids, and preserved artificial tears QID.

## 2023-06-09 DIAGNOSIS — H2511 Age-related nuclear cataract, right eye: Secondary | ICD-10-CM | POA: Diagnosis not present

## 2023-06-13 ENCOUNTER — Encounter (HOSPITAL_COMMUNITY): Payer: Self-pay

## 2023-06-13 ENCOUNTER — Encounter (HOSPITAL_COMMUNITY)
Admission: RE | Admit: 2023-06-13 | Discharge: 2023-06-13 | Disposition: A | Discharge status: Home / Self Care | Payer: Medicare HMO | Source: Ambulatory Visit | Attending: Optometry | Admitting: Optometry

## 2023-06-16 ENCOUNTER — Ambulatory Visit (HOSPITAL_COMMUNITY): Payer: Medicare HMO | Admitting: Anesthesiology

## 2023-06-16 ENCOUNTER — Ambulatory Visit (HOSPITAL_COMMUNITY)
Admission: RE | Admit: 2023-06-16 | Discharge: 2023-06-16 | Disposition: A | Discharge status: Home / Self Care | Payer: Medicare HMO | Source: Ambulatory Visit | Attending: Optometry | Admitting: Optometry

## 2023-06-16 ENCOUNTER — Encounter (HOSPITAL_COMMUNITY): Payer: Self-pay | Admitting: Optometry

## 2023-06-16 ENCOUNTER — Encounter (HOSPITAL_COMMUNITY)
Admission: RE | Disposition: A | Discharge status: Home / Self Care | Payer: Self-pay | Source: Ambulatory Visit | Attending: Optometry

## 2023-06-16 DIAGNOSIS — I10 Essential (primary) hypertension: Secondary | ICD-10-CM | POA: Insufficient documentation

## 2023-06-16 DIAGNOSIS — Z8673 Personal history of transient ischemic attack (TIA), and cerebral infarction without residual deficits: Secondary | ICD-10-CM | POA: Insufficient documentation

## 2023-06-16 DIAGNOSIS — H2511 Age-related nuclear cataract, right eye: Secondary | ICD-10-CM

## 2023-06-16 DIAGNOSIS — I25119 Atherosclerotic heart disease of native coronary artery with unspecified angina pectoris: Secondary | ICD-10-CM

## 2023-06-16 DIAGNOSIS — H04123 Dry eye syndrome of bilateral lacrimal glands: Secondary | ICD-10-CM | POA: Diagnosis not present

## 2023-06-16 DIAGNOSIS — I252 Old myocardial infarction: Secondary | ICD-10-CM | POA: Diagnosis not present

## 2023-06-16 DIAGNOSIS — J449 Chronic obstructive pulmonary disease, unspecified: Secondary | ICD-10-CM | POA: Diagnosis not present

## 2023-06-16 DIAGNOSIS — H5213 Myopia, bilateral: Secondary | ICD-10-CM | POA: Diagnosis not present

## 2023-06-16 DIAGNOSIS — F17200 Nicotine dependence, unspecified, uncomplicated: Secondary | ICD-10-CM | POA: Insufficient documentation

## 2023-06-16 DIAGNOSIS — F17210 Nicotine dependence, cigarettes, uncomplicated: Secondary | ICD-10-CM | POA: Diagnosis not present

## 2023-06-16 DIAGNOSIS — I2510 Atherosclerotic heart disease of native coronary artery without angina pectoris: Secondary | ICD-10-CM | POA: Insufficient documentation

## 2023-06-16 HISTORY — PX: CATARACT EXTRACTION W/PHACO: SHX586

## 2023-06-16 SURGERY — PHACOEMULSIFICATION, CATARACT, WITH IOL INSERTION
Anesthesia: Monitor Anesthesia Care | Site: Eye | Laterality: Right

## 2023-06-16 MED ORDER — POVIDONE-IODINE 5 % OP SOLN
OPHTHALMIC | Status: DC | PRN
  Administered 2023-06-16: 1 via OPHTHALMIC

## 2023-06-16 MED ORDER — MIDAZOLAM HCL 2 MG/2ML IJ SOLN
INTRAMUSCULAR | Status: DC | PRN
  Administered 2023-06-16: 2 mg via INTRAVENOUS

## 2023-06-16 MED ORDER — LACTATED RINGERS IV SOLN: INTRAVENOUS | Status: DC

## 2023-06-16 MED ORDER — SIGHTPATH DOSE#1 NA HYALUR & NA CHOND-NA HYALUR IO KIT
PACK | INTRAOCULAR | Status: DC | PRN
  Administered 2023-06-16: 1 via OPHTHALMIC

## 2023-06-16 MED ORDER — LIDOCAINE HCL 3.5 % OP GEL
1.0000 | Freq: Once | OPHTHALMIC | Status: AC
  Administered 2023-06-16: 1 via OPHTHALMIC

## 2023-06-16 MED ORDER — PHENYLEPHRINE HCL 2.5 % OP SOLN
1.0000 [drp] | OPHTHALMIC | Status: AC
  Administered 2023-06-16 (×3): 1 [drp] via OPHTHALMIC

## 2023-06-16 MED ORDER — STERILE WATER FOR IRRIGATION IR SOLN
Status: DC | PRN
  Administered 2023-06-16: 25 mL

## 2023-06-16 MED ORDER — TETRACAINE HCL 0.5 % OP SOLN
1.0000 [drp] | OPHTHALMIC | Status: AC
  Administered 2023-06-16 (×3): 1 [drp] via OPHTHALMIC

## 2023-06-16 MED ORDER — BSS IO SOLN
INTRAOCULAR | Status: DC | PRN
  Administered 2023-06-16: 15 mL via INTRAOCULAR

## 2023-06-16 MED ORDER — MIDAZOLAM HCL 2 MG/2ML IJ SOLN
INTRAMUSCULAR | Status: AC
  Filled 2023-06-16: qty 2

## 2023-06-16 MED ORDER — PHENYLEPHRINE-KETOROLAC 1-0.3 % IO SOLN
INTRAOCULAR | Status: DC | PRN
  Administered 2023-06-16: 500 mL via OPHTHALMIC

## 2023-06-16 MED ORDER — TROPICAMIDE 1 % OP SOLN
1.0000 [drp] | OPHTHALMIC | Status: AC
  Administered 2023-06-16 (×3): 1 [drp] via OPHTHALMIC

## 2023-06-16 MED ORDER — MOXIFLOXACIN HCL 5 MG/ML IO SOLN
INTRAOCULAR | Status: DC | PRN
  Administered 2023-06-16: .3 mL via OPHTHALMIC

## 2023-06-16 MED ORDER — LIDOCAINE HCL (PF) 1 % IJ SOLN
INTRAMUSCULAR | Status: DC | PRN
  Administered 2023-06-16: 1 mL

## 2023-06-16 SURGICAL SUPPLY — 14 items
CATARACT SUITE SIGHTPATH (MISCELLANEOUS) ×1
CLOTH BEACON ORANGE TIMEOUT ST (SAFETY) ×1 IMPLANT
DRSG TEGADERM 4X4.75 (GAUZE/BANDAGES/DRESSINGS) ×1 IMPLANT
EYE SHIELD UNIVERSAL CLEAR (GAUZE/BANDAGES/DRESSINGS) IMPLANT
FEE CATARACT SUITE SIGHTPATH (MISCELLANEOUS) ×1 IMPLANT
GLOVE BIOGEL PI IND STRL 7.0 (GLOVE) ×2 IMPLANT
LENS IOL TECNIS EYHANCE 13.0 (Intraocular Lens) IMPLANT
NDL HYPO 18GX1.5 BLUNT FILL (NEEDLE) ×1 IMPLANT
NEEDLE HYPO 18GX1.5 BLUNT FILL (NEEDLE) ×1
PAD ARMBOARD 7.5X6 YLW CONV (MISCELLANEOUS) ×1 IMPLANT
POSITIONER HEAD 8X9X4 ADT (SOFTGOODS) ×1 IMPLANT
SYR TB 1ML LL NO SAFETY (SYRINGE) ×1 IMPLANT
TAPE SURG TRANSPORE 1 IN (GAUZE/BANDAGES/DRESSINGS) IMPLANT
WATER STERILE IRR 250ML POUR (IV SOLUTION) ×1 IMPLANT

## 2023-06-16 NOTE — Op Note (Addendum)
Date of procedure: 06/16/23  Pre-operative diagnosis: Visually significant age-related nuclear cataract, Right Eye (H25.11)  Post-operative diagnosis: Visually significant age-related nuclear cataract, Right Eye  Procedure: Removal of cataract via phacoemulsification and insertion of intra-ocular lens J&J DIBOO +13.0D into the capsular bag of the Right Eye  Attending surgeon: Pecolia Ades, MD  Anesthesia: MAC, Topical Akten  Complications: None  Estimated Blood Loss: <34mL (minimal)  Specimens: None  Implants:  Implant Name Type Inv. Item Serial No. Manufacturer Lot No. LRB No. Used Action  TECNIS EYHANCE IOL Intraocular Lens  [id number] Laural Benes [employer] JOHNSON  Right 1 Implanted    Indications:  Visually significant age-related cataract, Right Eye  Procedure:  The patient was seen and identified in the pre-operative area. The operative eye was identified and dilated.  The operative eye was marked.  Topical anesthesia was administered to the operative eye.     The patient was then to the operative suite and placed in the supine position.  A timeout was performed confirming the patient, procedure to be performed, and all other relevant information.   The patient's face was prepped and draped in the usual fashion for intra-ocular surgery.  A lid speculum was placed into the operative eye and the surgical microscope moved into place and focused.  A superotemporal paracentesis was created using a 20 gauge paracentesis blade.  BSS mixed with Omidria, followed by 1% lidocaine was injected into the anterior chamber.  Viscoelastic was injected into the anterior chamber.  A temporal clear-corneal main wound incision was created using a 2.60mm microkeratome.  A continuous curvilinear capsulorrhexis was initiated using an irrigating cystitome and completed using capsulorrhexis forceps.  Hydrodissection and hydrodeliniation were performed.  Viscoelastic was injected into the anterior chamber.  A  phacoemulsification handpiece and a chopper as a second instrument were used to remove the nucleus and epinucleus. The irrigation/aspiration handpiece was used to remove any remaining cortical material.   The capsular bag was reinflated with viscoelastic, checked, and found to be intact.  The intraocular lens was inserted into the capsular bag.  The irrigation/aspiration handpiece was used to remove any remaining viscoelastic.  The clear corneal wound and paracentesis wounds were then hydrated and checked with Weck-Cels to be watertight. Moxifloxacin was instilled into the anterior chamber.  The lid-speculum and drape was removed, and the patient's face was cleaned with a wet and dry 4x4.  A clear shield was taped over the eye. The patient was taken to the post-operative care unit in good condition, having tolerated the procedure well.  Post-Op Instructions: The patient will follow up at [facility] for a same day post-operative evaluation and will receive all other orders and instructions.

## 2023-06-16 NOTE — Anesthesia Preprocedure Evaluation (Signed)
Anesthesia Evaluation  Patient identified by MRN, date of birth, ID band Patient awake    Reviewed: Allergy & Precautions, H&P , NPO status , Patient's Chart, lab work & pertinent test results, reviewed documented beta blocker date and time   Airway Mallampati: II  TM Distance: >3 FB Neck ROM: full    Dental no notable dental hx.    Pulmonary neg pulmonary ROS, COPD, Current Smoker and Patient abstained from smoking.   Pulmonary exam normal breath sounds clear to auscultation       Cardiovascular Exercise Tolerance: Good hypertension, + angina  + CAD and + Past MI  negative cardio ROS  Rhythm:regular Rate:Normal     Neuro/Psych   Anxiety     TIA Neuromuscular disease CVA negative neurological ROS  negative psych ROS   GI/Hepatic negative GI ROS, Neg liver ROS,GERD  ,,  Endo/Other  negative endocrine ROS    Renal/GU negative Renal ROS  negative genitourinary   Musculoskeletal   Abdominal   Peds  Hematology negative hematology ROS (+)   Anesthesia Other Findings   Reproductive/Obstetrics negative OB ROS                             Anesthesia Physical Anesthesia Plan  ASA: 3  Anesthesia Plan: MAC   Post-op Pain Management:    Induction:   PONV Risk Score and Plan:   Airway Management Planned:   Additional Equipment:   Intra-op Plan:   Post-operative Plan:   Informed Consent: I have reviewed the patients History and Physical, chart, labs and discussed the procedure including the risks, benefits and alternatives for the proposed anesthesia with the patient or authorized representative who has indicated his/her understanding and acceptance.     Dental Advisory Given  Plan Discussed with: CRNA  Anesthesia Plan Comments:        Anesthesia Quick Evaluation

## 2023-06-16 NOTE — Transfer of Care (Signed)
Immediate Anesthesia Transfer of Care Note  Patient: Sophia Briggs  Procedure(s) Performed: CATARACT EXTRACTION PHACO AND INTRAOCULAR LENS PLACEMENT (IOC) (Right: Eye)  Patient Location: Short Stay  Anesthesia Type:MAC  Level of Consciousness: awake, alert , and oriented  Airway & Oxygen Therapy: Patient Spontanous Breathing  Post-op Assessment: Report given to RN and Post -op Vital signs reviewed and stable  Post vital signs: Reviewed and stable  Last Vitals:  Vitals Value Taken Time  BP    Temp    Pulse    Resp    SpO2      Last Pain:  Vitals:   06/16/23 0832  TempSrc: Oral  PainSc: 0-No pain         Complications: No notable events documented.

## 2023-06-16 NOTE — Anesthesia Postprocedure Evaluation (Signed)
Anesthesia Post Note  Patient: Sophia Briggs  Procedure(s) Performed: CATARACT EXTRACTION PHACO AND INTRAOCULAR LENS PLACEMENT (IOC) (Right: Eye)  Patient location during evaluation: Phase II Anesthesia Type: MAC Level of consciousness: awake Pain management: pain level controlled Vital Signs Assessment: post-procedure vital signs reviewed and stable Respiratory status: spontaneous breathing and respiratory function stable Cardiovascular status: blood pressure returned to baseline and stable Postop Assessment: no headache and no apparent nausea or vomiting Anesthetic complications: no Comments: Late entry   No notable events documented.   Last Vitals:  Vitals:   06/16/23 0832 06/16/23 1016  BP: 121/67 130/78  Pulse: (!) 57 (!) 56  Resp: (!) 26 15  Temp: 36.4 C 36.4 C  SpO2: 100% 100%    Last Pain:  Vitals:   06/16/23 1016  TempSrc: Oral  PainSc: 0-No pain                 Windell Norfolk

## 2023-06-16 NOTE — Discharge Instructions (Signed)
Please discharge patient when stable, will follow up today with Dr. Tod Abrahamsen at the [facility] [place] office immediately following discharge.  Leave shield in place until visit.  All paperwork with discharge instructions will be given at the office.  [facility] Address:  [address]  [place], [place] [address]  Dr. Feliciano Wynter's Phone: [phone]  

## 2023-06-16 NOTE — Interval H&P Note (Signed)
History and Physical Interval Note:  06/16/2023 9:46 AM  The H and P was reviewed and updated. The patient was examined.  No changes were found after exam.  The surgical eye was marked.  Sophia Briggs

## 2023-06-19 ENCOUNTER — Encounter (HOSPITAL_COMMUNITY): Payer: Self-pay | Admitting: Optometry

## 2023-06-23 DIAGNOSIS — H2512 Age-related nuclear cataract, left eye: Secondary | ICD-10-CM | POA: Diagnosis not present

## 2023-06-28 ENCOUNTER — Encounter (HOSPITAL_COMMUNITY)
Admission: RE | Admit: 2023-06-28 | Discharge: 2023-06-28 | Disposition: A | Discharge status: Home / Self Care | Payer: Medicare HMO | Source: Ambulatory Visit | Attending: Optometry | Admitting: Optometry

## 2023-06-29 NOTE — H&P (Signed)
Surgical History & Physical  Patient Name: Sophia Briggs  DOB: Mar 11, 1946  Surgery: Cataract extraction with intraocular lens implant phacoemulsification; Left Eye Surgeon: Pecolia Ades MD Surgery Date: 06/30/2023 Pre-Op Date: 06/20/2023  HPI: A 76 Yr. old female patient 1. The patient is returning after cataract surgery. The right eye is affected. Since the last visit, the affected area feels improvement and is doing well. The patient's vision is improved. Patient is following medication instructions. Patient is having difficulties reading fine print, watching TV, glare problems with driving, all activities of daily living. This is negatively affecting the patient's quality of life and the patient is unable to function adequately in life with the current level of vision. Pt. is ready to proceed with OS, due to having blurred vision. HPI was performed by Pecolia Ades .  Medical History: Cataracts  Arthritis Heart Problem High Blood Pressure Stroke  Review of Systems Negative Allergic/Immunologic HTN Cardiovascular Negative Constitutional Negative Ear, Nose, Mouth & Throat Negative Endocrine Negative Eyes Negative Gastrointestinal Negative Genitourinary Negative Hemotologic/Lymphatic Negative Integumentary Arthritis Musculoskeletal Stroke Neurological Negative Psychiatry Negative Respiratory  Social Current every day smoker of Cigarettes   Medication Ciprofloxacin hcl, Prednisone Intensol,  atorvastatin ,  amlodipine ,  ketoconazole ,  ibuprofen ,  isosorbide mononitrate ,  nitroglycerin ,  carvedilol ,  losartan ,  pantoprazole ,  aspirin    Sx/Procedures Phaco c IOL OD,  Stents  Drug Allergies  amoxicillin   History & Physical: Heent: cataract  NECK: supple without bruits LUNGS: lungs clear to auscultation CV: regular rate and rhythm Abdomen: soft and non-tender  Impression & Plan: Assessment: 1.  CATARACT EXTRACTION STATUS; Right Eye (Z98.41) 2.   INTRAOCULAR LENS IOL (Z96.1) 3.  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  POD#4 Continue with eye drops as directed. Reviewed all post-op precautions. Call with increased redness, swelling, pain or loss of vision. Avoid swimming pools and hot tubs for 1 week.  2.  Doing well since surgery  3.  Cataracts are visually significant and account for the patient's complaints. Discussed all risks, benefits, procedures and recovery, including infection, loss of vision and eye, need for glasses after surgery or additional procedures. Patient understands changing glasses will not improve vision. Patient indicated understanding of procedure. All questions answered. Patient desires to have surgery, recommend phacoemulsification with intraocular lens. Patient to have preliminary testing necessary (Argos/IOL Master, Mac OCT, TOPO) Educational materials provided.  Plan: - Proceed with cataract surgery OS when ready - best distance vision OU, DIB00 - AL difference agrees with MRx difference - Discussed need for glasses due to astigmatism afterward - otherwise healthy macula and cornea

## 2023-06-30 ENCOUNTER — Encounter (HOSPITAL_COMMUNITY): Payer: Self-pay | Admitting: Optometry

## 2023-06-30 ENCOUNTER — Encounter (HOSPITAL_COMMUNITY)
Admission: RE | Disposition: A | Discharge status: Home / Self Care | Payer: Self-pay | Source: Ambulatory Visit | Attending: Optometry

## 2023-06-30 ENCOUNTER — Ambulatory Visit (HOSPITAL_COMMUNITY): Payer: Medicare HMO | Admitting: Anesthesiology

## 2023-06-30 ENCOUNTER — Ambulatory Visit (HOSPITAL_COMMUNITY)
Admission: RE | Admit: 2023-06-30 | Discharge: 2023-06-30 | Disposition: A | Discharge status: Home / Self Care | Payer: Medicare HMO | Source: Ambulatory Visit | Attending: Optometry | Admitting: Optometry

## 2023-06-30 DIAGNOSIS — F17210 Nicotine dependence, cigarettes, uncomplicated: Secondary | ICD-10-CM | POA: Insufficient documentation

## 2023-06-30 DIAGNOSIS — H25812 Combined forms of age-related cataract, left eye: Secondary | ICD-10-CM | POA: Diagnosis not present

## 2023-06-30 DIAGNOSIS — Z8673 Personal history of transient ischemic attack (TIA), and cerebral infarction without residual deficits: Secondary | ICD-10-CM | POA: Insufficient documentation

## 2023-06-30 DIAGNOSIS — I252 Old myocardial infarction: Secondary | ICD-10-CM | POA: Diagnosis not present

## 2023-06-30 DIAGNOSIS — I2510 Atherosclerotic heart disease of native coronary artery without angina pectoris: Secondary | ICD-10-CM

## 2023-06-30 DIAGNOSIS — J449 Chronic obstructive pulmonary disease, unspecified: Secondary | ICD-10-CM | POA: Diagnosis not present

## 2023-06-30 DIAGNOSIS — Z9841 Cataract extraction status, right eye: Secondary | ICD-10-CM | POA: Diagnosis not present

## 2023-06-30 DIAGNOSIS — Z961 Presence of intraocular lens: Secondary | ICD-10-CM | POA: Insufficient documentation

## 2023-06-30 DIAGNOSIS — H2512 Age-related nuclear cataract, left eye: Secondary | ICD-10-CM | POA: Diagnosis not present

## 2023-06-30 DIAGNOSIS — G709 Myoneural disorder, unspecified: Secondary | ICD-10-CM | POA: Insufficient documentation

## 2023-06-30 DIAGNOSIS — I25119 Atherosclerotic heart disease of native coronary artery with unspecified angina pectoris: Secondary | ICD-10-CM | POA: Insufficient documentation

## 2023-06-30 DIAGNOSIS — K219 Gastro-esophageal reflux disease without esophagitis: Secondary | ICD-10-CM | POA: Insufficient documentation

## 2023-06-30 DIAGNOSIS — I10 Essential (primary) hypertension: Secondary | ICD-10-CM | POA: Diagnosis not present

## 2023-06-30 HISTORY — PX: CATARACT EXTRACTION W/PHACO: SHX586

## 2023-06-30 SURGERY — PHACOEMULSIFICATION, CATARACT, WITH IOL INSERTION
Anesthesia: Monitor Anesthesia Care | Site: Eye | Laterality: Left

## 2023-06-30 MED ORDER — BSS IO SOLN
INTRAOCULAR | Status: DC | PRN
  Administered 2023-06-30: 500 mL via INTRAOCULAR
  Administered 2023-06-30: 15 mL via INTRAOCULAR

## 2023-06-30 MED ORDER — TETRACAINE HCL 0.5 % OP SOLN
1.0000 [drp] | OPHTHALMIC | Status: AC | PRN
  Administered 2023-06-30 (×3): 1 [drp] via OPHTHALMIC

## 2023-06-30 MED ORDER — LIDOCAINE HCL 3.5 % OP GEL
1.0000 | Freq: Once | OPHTHALMIC | Status: AC
  Administered 2023-06-30: 1 via OPHTHALMIC

## 2023-06-30 MED ORDER — PHENYLEPHRINE-KETOROLAC 1-0.3 % IO SOLN
INTRAOCULAR | Status: DC | PRN
  Administered 2023-06-30: 500 mL via OPHTHALMIC

## 2023-06-30 MED ORDER — LIDOCAINE HCL (PF) 1 % IJ SOLN
INTRAMUSCULAR | Status: DC | PRN
  Administered 2023-06-30: 1 mL

## 2023-06-30 MED ORDER — PHENYLEPHRINE HCL 2.5 % OP SOLN
1.0000 [drp] | OPHTHALMIC | Status: AC | PRN
  Administered 2023-06-30 (×3): 1 [drp] via OPHTHALMIC

## 2023-06-30 MED ORDER — TROPICAMIDE 1 % OP SOLN
1.0000 [drp] | OPHTHALMIC | Status: AC | PRN
  Administered 2023-06-30 (×3): 1 [drp] via OPHTHALMIC

## 2023-06-30 MED ORDER — MOXIFLOXACIN HCL 5 MG/ML IO SOLN
INTRAOCULAR | Status: DC | PRN
  Administered 2023-06-30: .3 mL via OPHTHALMIC

## 2023-06-30 MED ORDER — MIDAZOLAM HCL 2 MG/2ML IJ SOLN
INTRAMUSCULAR | Status: AC
  Filled 2023-06-30: qty 2

## 2023-06-30 MED ORDER — STERILE WATER FOR IRRIGATION IR SOLN
Status: DC | PRN
  Administered 2023-06-30: 50 mL

## 2023-06-30 MED ORDER — SODIUM CHLORIDE 0.9% FLUSH
INTRAVENOUS | Status: DC | PRN
  Administered 2023-06-30: 3 mL via INTRAVENOUS

## 2023-06-30 MED ORDER — SIGHTPATH DOSE#1 NA HYALUR & NA CHOND-NA HYALUR IO KIT
PACK | INTRAOCULAR | Status: DC | PRN
  Administered 2023-06-30: 1 via OPHTHALMIC

## 2023-06-30 MED ORDER — POVIDONE-IODINE 5 % OP SOLN
OPHTHALMIC | Status: DC | PRN
  Administered 2023-06-30: 1 via OPHTHALMIC

## 2023-06-30 MED ORDER — MIDAZOLAM HCL 2 MG/2ML IJ SOLN
INTRAMUSCULAR | Status: DC | PRN
  Administered 2023-06-30: 2 mg via INTRAVENOUS

## 2023-06-30 SURGICAL SUPPLY — 15 items
CATARACT SUITE SIGHTPATH (MISCELLANEOUS) ×1
CLOTH BEACON ORANGE TIMEOUT ST (SAFETY) ×1 IMPLANT
DRSG TEGADERM 4X4.75 (GAUZE/BANDAGES/DRESSINGS) ×1 IMPLANT
EYE SHIELD UNIVERSAL CLEAR (GAUZE/BANDAGES/DRESSINGS) IMPLANT
FEE CATARACT SUITE SIGHTPATH (MISCELLANEOUS) ×1 IMPLANT
GLOVE BIOGEL PI IND STRL 7.0 (GLOVE) ×2 IMPLANT
LENS IOL TECNIS EYHANCE 15.0 (Intraocular Lens) IMPLANT
NDL HYPO 18GX1.5 BLUNT FILL (NEEDLE) ×1 IMPLANT
NEEDLE HYPO 18GX1.5 BLUNT FILL (NEEDLE) ×1
PAD ARMBOARD 7.5X6 YLW CONV (MISCELLANEOUS) ×1 IMPLANT
POSITIONER HEAD 8X9X4 ADT (SOFTGOODS) ×1 IMPLANT
RING MALYGIN 7.0 (MISCELLANEOUS) IMPLANT
SYR TB 1ML LL NO SAFETY (SYRINGE) ×1 IMPLANT
TAPE SURG TRANSPORE 1 IN (GAUZE/BANDAGES/DRESSINGS) IMPLANT
WATER STERILE IRR 250ML POUR (IV SOLUTION) ×1 IMPLANT

## 2023-06-30 NOTE — Transfer of Care (Signed)
Immediate Anesthesia Transfer of Care Note  Patient: Sophia Briggs  Procedure(s) Performed: CATARACT EXTRACTION PHACO AND INTRAOCULAR LENS PLACEMENT (IOC) (Left)  Patient Location: Short Stay  Anesthesia Type:MAC  Level of Consciousness: awake, alert , and oriented  Airway & Oxygen Therapy: Patient Spontanous Breathing  Post-op Assessment: Report given to RN and Post -op Vital signs reviewed and stable  Post vital signs: Reviewed and stable  Last Vitals:  Vitals Value Taken Time  BP 153/74 06/30/23 1220  Temp 36.6 C 06/30/23 1220  Pulse 52 06/30/23 1220  Resp 16 06/30/23 1220  SpO2 100 % 06/30/23 1220    Last Pain:  Vitals:   06/30/23 1220  TempSrc: Oral  PainSc: 0-No pain         Complications: No notable events documented.

## 2023-06-30 NOTE — Discharge Instructions (Signed)
Please discharge patient when stable, will follow up today with Dr. Tod Abrahamsen at the [facility] [place] office immediately following discharge.  Leave shield in place until visit.  All paperwork with discharge instructions will be given at the office.  [facility] Address:  [address]  [place], [place] [address]  Dr. Feliciano Wynter's Phone: [phone]  

## 2023-06-30 NOTE — Op Note (Signed)
Date of procedure: 06/30/23  Pre-operative diagnosis: Visually significant age-related nuclear cataract, Left Eye (H25.12)  Post-operative diagnosis: Visually significant age-related nuclear cataract, Left Eye  Procedure: Removal of cataract via phacoemulsification and insertion of intra-ocular lens J&J DIB00 +15.0D into the capsular bag of the Left Eye  Attending surgeon: Ronal Fear, MD  Anesthesia: MAC, Topical Akten  Complications: None  Estimated Blood Loss: <57mL (minimal)  Specimens: None  Implants:  Implant Name Type Inv. Item Serial No. Manufacturer Lot No. LRB No. Used Action  LENS IOL TECNIS EYHANCE 15.0 - [id number] Intraocular Lens LENS IOL TECNIS EYHANCE 15.0 [id number] [employer]  Left 1 Implanted    Indications:  Visually significant age-related cataract, Left Eye  Procedure:  The patient was seen and identified in the pre-operative area. The operative eye was identified and dilated.  The operative eye was marked.  Topical anesthesia was administered to the operative eye.     The patient was then to the operative suite and placed in the supine position.  A timeout was performed confirming the patient, procedure to be performed, and all other relevant information.   The patient's face was prepped and draped in the usual fashion for intra-ocular surgery.  A lid speculum was placed into the operative eye and the surgical microscope moved into place and focused.  An inferotemporal paracentesis was created using a 20 gauge paracentesis blade.  BSS mixed with Omidria, followed by 1% lidocaine was injected into the anterior chamber.  Viscoelastic was injected into the anterior chamber.  A temporal clear-corneal main wound incision was created using a 2.72mm microkeratome.  A continuous curvilinear capsulorrhexis was initiated using an irrigating cystitome and completed using capsulorrhexis forceps.  Hydrodissection and hydrodeliniation were performed.  Viscoelastic was  injected into the anterior chamber.  A phacoemulsification handpiece and a chopper as a second instrument were used to remove the nucleus and epinucleus. The irrigation/aspiration handpiece was used to remove any remaining cortical material.   The capsular bag was reinflated with viscoelastic, checked, and found to be intact.  The intraocular lens was inserted into the capsular bag.  The irrigation/aspiration handpiece was used to remove any remaining viscoelastic.  The clear corneal wound and paracentesis wounds were then hydrated and checked with Weck-Cels to be watertight. Moxifloxacin was instilled into the anterior chamber.  The lid-speculum and drape was removed, and the patient's face was cleaned with a wet and dry 4x4.  A clear shield was taped over the eye. The patient was taken to the post-operative care unit in good condition, having tolerated the procedure well.  Post-Op Instructions: The patient will follow up at [facility] for a same day post-operative evaluation and will receive all other orders and instructions.

## 2023-06-30 NOTE — Interval H&P Note (Signed)
History and Physical Interval Note:  06/30/2023 11:53 AM  The H and P was reviewed and updated. The patient was examined.  No changes were found after exam.  The surgical eye was marked.  Sophia Briggs

## 2023-06-30 NOTE — Anesthesia Preprocedure Evaluation (Signed)
Anesthesia Evaluation  Patient identified by MRN, date of birth, ID band Patient awake    Reviewed: Allergy & Precautions, H&P , NPO status , Patient's Chart, lab work & pertinent test results, reviewed documented beta blocker date and time   Airway Mallampati: II  TM Distance: >3 FB Neck ROM: full    Dental no notable dental hx.    Pulmonary neg pulmonary ROS, COPD, Current Smoker and Patient abstained from smoking.   Pulmonary exam normal breath sounds clear to auscultation       Cardiovascular Exercise Tolerance: Good hypertension, + angina  + CAD and + Past MI  negative cardio ROS  Rhythm:regular Rate:Normal     Neuro/Psych   Anxiety     TIA Neuromuscular disease CVA negative neurological ROS  negative psych ROS   GI/Hepatic negative GI ROS, Neg liver ROS,GERD  ,,  Endo/Other  negative endocrine ROS    Renal/GU negative Renal ROS  negative genitourinary   Musculoskeletal   Abdominal   Peds  Hematology negative hematology ROS (+)   Anesthesia Other Findings   Reproductive/Obstetrics negative OB ROS                             Anesthesia Physical Anesthesia Plan  ASA: 3  Anesthesia Plan: MAC   Post-op Pain Management:    Induction:   PONV Risk Score and Plan:   Airway Management Planned:   Additional Equipment:   Intra-op Plan:   Post-operative Plan:   Informed Consent: I have reviewed the patients History and Physical, chart, labs and discussed the procedure including the risks, benefits and alternatives for the proposed anesthesia with the patient or authorized representative who has indicated his/her understanding and acceptance.     Dental Advisory Given  Plan Discussed with: CRNA  Anesthesia Plan Comments:        Anesthesia Quick Evaluation

## 2023-07-03 ENCOUNTER — Encounter (HOSPITAL_COMMUNITY): Payer: Self-pay | Admitting: Optometry

## 2023-07-03 NOTE — Anesthesia Postprocedure Evaluation (Signed)
Anesthesia Post Note  Patient: Sophia Briggs  Procedure(s) Performed: CATARACT EXTRACTION PHACO AND INTRAOCULAR LENS PLACEMENT (IOC) (Left: Eye)  Patient location during evaluation: Phase II Anesthesia Type: MAC Level of consciousness: awake Pain management: pain level controlled Vital Signs Assessment: post-procedure vital signs reviewed and stable Respiratory status: spontaneous breathing and respiratory function stable Cardiovascular status: blood pressure returned to baseline and stable Postop Assessment: no headache and no apparent nausea or vomiting Anesthetic complications: no Comments: Late entry   No notable events documented.   Last Vitals:  Vitals:   06/30/23 1024 06/30/23 1220  BP: 137/85 (!) 153/74  Pulse: 65 (!) 52  Resp: 10 16  Temp: 36.6 C 36.6 C  SpO2: 99% 100%    Last Pain:  Vitals:   06/30/23 1220  TempSrc: Oral  PainSc: 0-No pain                 Windell Norfolk

## 2023-07-08 DIAGNOSIS — M1990 Unspecified osteoarthritis, unspecified site: Secondary | ICD-10-CM | POA: Diagnosis not present

## 2023-07-08 DIAGNOSIS — I10 Essential (primary) hypertension: Secondary | ICD-10-CM | POA: Diagnosis not present

## 2023-08-08 DIAGNOSIS — I10 Essential (primary) hypertension: Secondary | ICD-10-CM | POA: Diagnosis not present

## 2023-08-08 DIAGNOSIS — M1990 Unspecified osteoarthritis, unspecified site: Secondary | ICD-10-CM | POA: Diagnosis not present

## 2023-09-07 DIAGNOSIS — I10 Essential (primary) hypertension: Secondary | ICD-10-CM | POA: Diagnosis not present

## 2023-09-07 DIAGNOSIS — M1990 Unspecified osteoarthritis, unspecified site: Secondary | ICD-10-CM | POA: Diagnosis not present

## 2023-10-08 DIAGNOSIS — I10 Essential (primary) hypertension: Secondary | ICD-10-CM | POA: Diagnosis not present

## 2023-10-08 DIAGNOSIS — M1990 Unspecified osteoarthritis, unspecified site: Secondary | ICD-10-CM | POA: Diagnosis not present

## 2023-10-30 ENCOUNTER — Encounter: Payer: Self-pay | Admitting: Cardiovascular Disease

## 2023-10-30 NOTE — Telephone Encounter (Signed)
Called and spoke with patient. Patient with complaint of intermittent chest pain times one week. Patient reports that it feels like indigestion. Patient reports that the pain improves with nitroglycerin and belching. Patient reports that she had some shortness of breath with exertion early today. Patient denies any current symptoms. Patient has an appointment scheduled for 10/31/23. Pt made aware of ED precaution should any new symptoms develop or worsen.

## 2023-10-30 NOTE — Progress Notes (Unsigned)
Home        Date:  10/31/2023   ID:  Mckay, Brandt 1945/12/25, MRN [id number]  Patient Location:  [address] [place] [address]   Provider location:   [facility], [place] office  PCP:  Benetta Spar, MD  Cardiologist:  [facility]  Chief Complaint  Patient presents with   Chest pain     Patient c/o shortness of breath with mild exertion & chest pain and frequently belching for the past 2 weeks.      History of Present Illness:    AKI BURDIN is a 78 y.o. female past medical history of coronary artery disease, previous stent x2 in 2009 to her LAD,  Hypertension, Hyperlipidemia, Continued tobacco abuse,  TIA   presented to the hospital 04/16/2013 with chest pain. Initial troponin 0.6 with MB greater than 5. Cardiac catheterization 04/2013 showed severe ostial D2 and D3 disease. The D2 ostium was at the distal end of the LAD stent. LAD stent was patent. D3 was a small vessel. Ejection fraction 55%. No intervention was performed.  She presents today for follow-up of her coronary artery disease  Last seen in clinic 12/22 Presents today with family When eating will get GI gas Seems to happen every day When she gets gas, takes nitro, belches and chest fullness resolves Sometimes more than 1 nitro to get gas to come out Reports that she takes PPI  On prior office visit reported having GI gas, was eating lots of onions, vegetables, chest pain would resolve with belching  Not having chest discomfort on exertion but sedentary at baseline  Denies any PND orthopnea, no lower extremity edema Mild shortness of breath on exertion  Discussed prior stress test 2023, no significant ischemia No recent lab work available through primary care  Lab work 2021 Total cholesterol 150 LDL 78   EKG personally reviewed by myself on todays visit EKG Interpretation Date/Time:  Tuesday October 31 2023 12:06:45 EST Ventricular Rate:  61 PR  Interval:  182 QRS Duration:  130 QT Interval:  444 QTC Calculation: 446 R Axis:   -12  Text Interpretation: Normal sinus rhythm Non-specific intra-ventricular conduction block Minimal voltage criteria for LVH, may be normal variant ( Cornell product ) Cannot rule out Anterior infarct (cited on or before 01-Jun-2020) When compared with ECG of 01-Jun-2020 13:39, No significant change was found Confirmed by Julien Nordmann [id number]) on 10/31/2023 12:14:54 PM     Other past medical history Prior significant stress at home in the past, as her daughter is sick, has a tracheostomy tube, previous stroke.    Past Medical History:  Diagnosis Date   Acute MI [facility])    Allergy    Anxiety    Arthritis    all over   Back pain    COPD (chronic obstructive pulmonary disease) (HCC)    Coronary artery disease    CTS (carpal tunnel syndrome) 01/06/2012   GERD (gastroesophageal reflux disease)    Hyperlipidemia    Hypertension    Stroke [facility])    Syncope and collapse    TIA (transient ischemic attack)    Past Surgical History:  Procedure Laterality Date   CARDIAC CATHETERIZATION     stent placement 2006 at [facility]    CATARACT EXTRACTION W/PHACO Right 06/16/2023   Procedure: CATARACT EXTRACTION PHACO AND INTRAOCULAR LENS PLACEMENT (IOC);  Surgeon: Pecolia Ades, MD;  Location: [facility];  Service: Ophthalmology;  Laterality: Right;  CDE: 4.81  CATARACT EXTRACTION W/PHACO Left 06/30/2023   Procedure: CATARACT EXTRACTION PHACO AND INTRAOCULAR LENS PLACEMENT (IOC);  Surgeon: Pecolia Ades, MD;  Location: [facility];  Service: Ophthalmology;  Laterality: Left;  CDE: 6.82   COLONOSCOPY N/A 06/08/2018   Procedure: COLONOSCOPY;  Surgeon: West Bali, MD;  Location: [facility];  Service: Endoscopy;  Laterality: N/A;  12:15pm   COLONOSCOPY WITH PROPOFOL N/A 04/27/2021   Procedure: COLONOSCOPY WITH PROPOFOL;  Surgeon: Lanelle Bal, DO;  Location: [facility];  Service: Endoscopy;   Laterality: N/A;  ASA II / 12:30   COLONOSCOPY WITH PROPOFOL N/A 01/24/2022   Procedure: COLONOSCOPY WITH PROPOFOL;  Surgeon: Lanelle Bal, DO;  Location: [facility];  Service: Endoscopy;  Laterality: N/A;  9:15am   PAROTIDECTOMY     POLYPECTOMY  06/08/2018   Procedure: POLYPECTOMY;  Surgeon: West Bali, MD;  Location: [facility];  Service: Endoscopy;;  cecal polyps times 6 ( 4cs, 2 hs), ascending colon polyps ( 2cs, 2hs) hepatic flexure polyps (4 cs, 5 hs), transverse colon polyps  splenic flexure polyp times 2 cs, 2 cb   POLYPECTOMY  04/27/2021   Procedure: POLYPECTOMY;  Surgeon: Lanelle Bal, DO;  [facility];  Service: Endoscopy;;   POLYPECTOMY  01/24/2022   Procedure: POLYPECTOMY;  Surgeon: Lanelle Bal, DO;  [facility];  Service: Endoscopy;;   TUBAL LIGATION       Allergies:   Amlodipine, Atorvastatin, Clopidogrel bisulfate, and Linzess [linaclotide]   Social History   Tobacco Use   Smoking status: Some Days    Current packs/day: 0.10    Average packs/day: 0.1 packs/day for 10.0 years (1.0 ttl pk-yrs)    Types: Cigarettes   Smokeless tobacco: Never   Tobacco comments:    up to 5 cigarettes per day  Vaping Use   Vaping status: Never Used  Substance Use Topics   Alcohol use: Yes    Comment: social   Drug use: Yes    Frequency: 7.0 times per week    Types: Marijuana    Comment: daily    Current Outpatient Medications on File Prior to Visit  Medication Sig Dispense Refill   amLODipine (NORVASC) 10 MG tablet Take 10 mg by mouth daily.     aspirin 81 MG tablet Take 162 mg by mouth at bedtime.      atorvastatin (LIPITOR) 40 MG tablet Take 1 tablet (40 mg total) by mouth daily. 90 tablet 3   carvedilol (COREG) 25 MG tablet Take 1 tablet (25 mg total) by mouth 2 (two) times daily with a meal. 180 tablet 3   cholecalciferol (VITAMIN D) 1000 UNITS tablet Take 2,000 Units by mouth daily.     hydrOXYzine (ATARAX/VISTARIL) 25 MG tablet  Take 25 mg by mouth 3 (three) times daily as needed for anxiety.     isosorbide mononitrate (IMDUR) 30 MG 24 hr tablet Take 1 tablet (30 mg total) by mouth daily. 90 tablet 3   losartan-hydrochlorothiazide (HYZAAR) 100-25 MG tablet Take 1 tablet by mouth daily. 90 tablet 3   nitroGLYCERIN (NITROSTAT) 0.4 MG SL tablet DISSOLVE ONE TABLET UNDER THE TONGUE EVERY 5 MINUTES AS NEEDED FOR CHEST PAIN.  DO NOT EXCEED A TOTAL OF 3 DOSES IN 15 MINUTES 25 tablet 0   pantoprazole (PROTONIX) 40 MG tablet Take 1 tablet (40 mg total) by mouth daily. 90 tablet 3   No current facility-administered medications on file prior to visit.     Family  Hx: The patient's family history includes Aneurysm in her sister; Arthritis in her father and mother; Cancer in her father; Early death in her son; Heart disease (age of onset: 80) in her brother; Hypertension in her mother. There is no history of Colon cancer. She was adopted.  ROS:   Please see the history of present illness.    Review of Systems  Constitutional: Negative.   HENT: Negative.    Respiratory: Negative.    Cardiovascular:  Positive for chest pain.  Gastrointestinal:  Positive for heartburn.  Musculoskeletal:  Positive for back pain.  Neurological: Negative.   Psychiatric/Behavioral: Negative.    All other systems reviewed and are negative.    Labs/Other Tests and Data Reviewed:    Recent Labs: No results found for requested labs within last 365 days.   Recent Lipid Panel Lab Results  Component Value Date/Time   CHOL 137 06/22/2017 10:54 AM   TRIG 85 06/22/2017 10:54 AM   HDL 45 (L) 06/22/2017 10:54 AM   CHOLHDL 3.0 06/22/2017 10:54 AM   LDLCALC 75 06/22/2017 10:54 AM    Wt Readings from Last 3 Encounters:  10/31/23 141 lb (64 kg)  06/30/23 142 lb 13.7 oz (64.8 kg)  06/16/23 142 lb 13.7 oz (64.8 kg)     Exam:    Vital Signs: Vital signs may also be detailed in the HPI BP (!) 140/80 (BP Location: Left Arm, Patient Position:  Sitting, Cuff Size: Normal)   Pulse 61   Ht 5\' 7"  (1.702 m)   Wt 141 lb (64 kg)   LMP 11/14/1997   SpO2 98%   BMI 22.08 kg/m   Constitutional:  oriented to person, place, and time. No distress.  HENT:  Head: Grossly normal Eyes:  no discharge. No scleral icterus.  Neck: No JVD, no carotid bruits  Cardiovascular: Regular rate and rhythm, no murmurs appreciated Pulmonary/Chest: Clear to auscultation bilaterally, no wheezes or rails Abdominal: Soft.  no distension.  no tenderness.  Musculoskeletal: Normal range of motion Neurological:  normal muscle tone. Coordination normal. No atrophy Skin: Skin warm and dry Psychiatric: normal affect, pleasant  ASSESSMENT & PLAN:    Coronary artery disease of native artery of native heart with stable angina pectoris (HCC) -  Atypical chest discomfort relieved by belching Similar symptoms to prior office visit Not a good candidate for cardiac CTA given prior stenting We did do Myoview for symptoms 2023  showing no significant ischemia His symptoms get worse recommend she call our office Recommend she try to avoid some of the vegetables that might be giving her gas  Smoker We have encouraged her to continue to work on weaning her cigarettes and smoking cessation. She will continue to work on this and does not want any assistance with chantix.    Mixed hyperlipidemia -  Continue Lipitor daily We have requested lab work from primary care  HTN Blood pressure is well controlled on today's visit. No changes made to the medications.  Dysphagia Reports food sticking, lots of gas, chest pain relieved with belching Avoid onions, Consider Gas-X If symptoms get worse may need to be evaluated by GI   Signed, Julien Nordmann, MD  10/31/2023 12:14 PM    [facility] [address], [place], [place] [address]

## 2023-10-31 ENCOUNTER — Ambulatory Visit: Payer: Medicare HMO | Attending: Cardiovascular Disease | Admitting: Cardiovascular Disease

## 2023-10-31 ENCOUNTER — Encounter: Payer: Self-pay | Admitting: Cardiovascular Disease

## 2023-10-31 VITALS — BP 140/80 | HR 61 | Ht 67.0 in | Wt 141.0 lb

## 2023-10-31 DIAGNOSIS — G459 Transient cerebral ischemic attack, unspecified: Secondary | ICD-10-CM

## 2023-10-31 DIAGNOSIS — E782 Mixed hyperlipidemia: Secondary | ICD-10-CM

## 2023-10-31 DIAGNOSIS — I25118 Atherosclerotic heart disease of native coronary artery with other forms of angina pectoris: Secondary | ICD-10-CM

## 2023-10-31 MED ORDER — CARVEDILOL 25 MG PO TABS: 25.0000 mg | ORAL_TABLET | Freq: Two times a day (BID) | ORAL | 3 refills | Status: AC

## 2023-10-31 MED ORDER — NITROGLYCERIN 0.4 MG SL SUBL: SUBLINGUAL_TABLET | SUBLINGUAL | 1 refills | Status: AC

## 2023-10-31 MED ORDER — ISOSORBIDE MONONITRATE ER 30 MG PO TB24: 30.0000 mg | ORAL_TABLET | Freq: Every day | ORAL | 3 refills | Status: AC

## 2023-10-31 MED ORDER — LOSARTAN POTASSIUM-HCTZ 100-25 MG PO TABS: 1.0000 | ORAL_TABLET | Freq: Every day | ORAL | 3 refills | Status: AC

## 2023-10-31 MED ORDER — AMLODIPINE BESYLATE 10 MG PO TABS: 10.0000 mg | ORAL_TABLET | Freq: Every day | ORAL | 3 refills | Status: AC

## 2023-10-31 MED ORDER — ATORVASTATIN CALCIUM 40 MG PO TABS: 40.0000 mg | ORAL_TABLET | Freq: Every day | ORAL | 3 refills | Status: AC

## 2023-10-31 NOTE — Patient Instructions (Addendum)
Medication Instructions:  No changes  If you need a refill on your cardiac medications before your next appointment, please call your pharmacy.   Lab work: Labss from PMD  Testing/Procedures: No new testing needed  Follow-Up: At [facility], you and your health needs are our priority.  As part of our continuing mission to provide you with exceptional heart care, we have created designated Provider Care Teams.  These Care Teams include your primary Cardiologist (physician) and Advanced Practice Providers (APPs -  Physician Assistants and Nurse Practitioners) who all work together to provide you with the care you need, when you need it.  You will need a follow up appointment in 12 months  Providers on your designated Care Team:   Nicolasa Ducking, NP Eula Listen, PA-C Cadence Fransico Michael, [place]  COVID-19 Vaccine Information can be found at: [email or web address] For questions related to vaccine distribution or appointments, please email [email or web address] or call [phone].

## 2023-11-08 DIAGNOSIS — M1990 Unspecified osteoarthritis, unspecified site: Secondary | ICD-10-CM | POA: Diagnosis not present

## 2023-11-08 DIAGNOSIS — I10 Essential (primary) hypertension: Secondary | ICD-10-CM | POA: Diagnosis not present

## 2023-12-11 DIAGNOSIS — I10 Essential (primary) hypertension: Secondary | ICD-10-CM | POA: Diagnosis not present

## 2023-12-11 DIAGNOSIS — Z1159 Encounter for screening for other viral diseases: Secondary | ICD-10-CM | POA: Diagnosis not present

## 2023-12-11 DIAGNOSIS — Z0001 Encounter for general adult medical examination with abnormal findings: Secondary | ICD-10-CM | POA: Diagnosis not present

## 2023-12-11 DIAGNOSIS — K219 Gastro-esophageal reflux disease without esophagitis: Secondary | ICD-10-CM | POA: Diagnosis not present

## 2024-01-05 DIAGNOSIS — M1990 Unspecified osteoarthritis, unspecified site: Secondary | ICD-10-CM | POA: Diagnosis not present

## 2024-01-05 DIAGNOSIS — I10 Essential (primary) hypertension: Secondary | ICD-10-CM | POA: Diagnosis not present

## 2024-01-23 DIAGNOSIS — R42 Dizziness and giddiness: Secondary | ICD-10-CM | POA: Diagnosis not present

## 2024-01-23 DIAGNOSIS — I10 Essential (primary) hypertension: Secondary | ICD-10-CM | POA: Diagnosis not present

## 2024-01-23 DIAGNOSIS — K055 Other periodontal diseases: Secondary | ICD-10-CM | POA: Diagnosis not present

## 2024-02-22 DIAGNOSIS — I10 Essential (primary) hypertension: Secondary | ICD-10-CM | POA: Diagnosis not present

## 2024-02-22 DIAGNOSIS — M1990 Unspecified osteoarthritis, unspecified site: Secondary | ICD-10-CM | POA: Diagnosis not present

## 2024-03-24 DIAGNOSIS — M1990 Unspecified osteoarthritis, unspecified site: Secondary | ICD-10-CM | POA: Diagnosis not present

## 2024-03-24 DIAGNOSIS — I10 Essential (primary) hypertension: Secondary | ICD-10-CM | POA: Diagnosis not present

## 2024-04-23 DIAGNOSIS — I10 Essential (primary) hypertension: Secondary | ICD-10-CM | POA: Diagnosis not present

## 2024-04-23 DIAGNOSIS — M1990 Unspecified osteoarthritis, unspecified site: Secondary | ICD-10-CM | POA: Diagnosis not present

## 2024-05-24 DIAGNOSIS — I10 Essential (primary) hypertension: Secondary | ICD-10-CM | POA: Diagnosis not present

## 2024-05-24 DIAGNOSIS — M1990 Unspecified osteoarthritis, unspecified site: Secondary | ICD-10-CM | POA: Diagnosis not present

## 2024-06-05 DIAGNOSIS — K219 Gastro-esophageal reflux disease without esophagitis: Secondary | ICD-10-CM | POA: Diagnosis not present

## 2024-06-05 DIAGNOSIS — I2510 Atherosclerotic heart disease of native coronary artery without angina pectoris: Secondary | ICD-10-CM | POA: Diagnosis not present

## 2024-06-05 DIAGNOSIS — I10 Essential (primary) hypertension: Secondary | ICD-10-CM | POA: Diagnosis not present

## 2024-06-05 DIAGNOSIS — M1990 Unspecified osteoarthritis, unspecified site: Secondary | ICD-10-CM | POA: Diagnosis not present

## 2024-06-05 DIAGNOSIS — L209 Atopic dermatitis, unspecified: Secondary | ICD-10-CM | POA: Diagnosis not present

## 2024-07-05 DIAGNOSIS — M1990 Unspecified osteoarthritis, unspecified site: Secondary | ICD-10-CM | POA: Diagnosis not present

## 2024-07-05 DIAGNOSIS — I10 Essential (primary) hypertension: Secondary | ICD-10-CM | POA: Diagnosis not present

## 2024-07-18 DIAGNOSIS — Z23 Encounter for immunization: Secondary | ICD-10-CM | POA: Diagnosis not present

## 2024-08-05 DIAGNOSIS — I10 Essential (primary) hypertension: Secondary | ICD-10-CM | POA: Diagnosis not present

## 2024-08-05 DIAGNOSIS — M1990 Unspecified osteoarthritis, unspecified site: Secondary | ICD-10-CM | POA: Diagnosis not present

## 2024-09-04 DIAGNOSIS — I10 Essential (primary) hypertension: Secondary | ICD-10-CM | POA: Diagnosis not present

## 2024-09-04 DIAGNOSIS — M1990 Unspecified osteoarthritis, unspecified site: Secondary | ICD-10-CM | POA: Diagnosis not present

## 2024-09-06 ENCOUNTER — Encounter: Payer: Self-pay | Admitting: *Deleted

## 2024-09-06 NOTE — Progress Notes (Signed)
 Sophia Briggs                                          MRN: [id number]   09/06/2024   The [facility] Quality Team Specialist reviewed this patient medical record for the purposes of chart review for care gap closure. The following were reviewed: chart review for care gap closure-controlling blood pressure.    [facility]I Quality Team

## 2024-09-09 DIAGNOSIS — H524 Presbyopia: Secondary | ICD-10-CM | POA: Diagnosis not present

## 2024-09-09 DIAGNOSIS — H5203 Hypermetropia, bilateral: Secondary | ICD-10-CM | POA: Diagnosis not present

## 2024-09-09 DIAGNOSIS — Z961 Presence of intraocular lens: Secondary | ICD-10-CM | POA: Diagnosis not present

## 2024-09-09 DIAGNOSIS — H43813 Vitreous degeneration, bilateral: Secondary | ICD-10-CM | POA: Diagnosis not present

## 2024-09-09 DIAGNOSIS — H52223 Regular astigmatism, bilateral: Secondary | ICD-10-CM | POA: Diagnosis not present

## 2024-10-15 NOTE — Progress Notes (Signed)
 Sophia Briggs                                          MRN: [id number]   10/15/2024   The [facility] Quality Team Specialist reviewed this patient medical record for the purposes of chart review for care gap closure. The following were reviewed: chart review for care gap closure-controlling blood pressure.    [facility]BCI Quality Team

## 2024-11-21 ENCOUNTER — Ambulatory Visit: Admitting: Nurse Practitioner
